# Patient Record
Sex: Male | Born: 1980 | Race: Black or African American | Hispanic: No | Marital: Married | Smoking: Former smoker
Health system: Southern US, Community
[De-identification: ages and names within clinical notes are randomized; demographics above are authoritative.]

## PROBLEM LIST (undated history)

## (undated) DIAGNOSIS — N200 Calculus of kidney: Secondary | ICD-10-CM

## (undated) DIAGNOSIS — I10 Essential (primary) hypertension: Secondary | ICD-10-CM

## (undated) DIAGNOSIS — E119 Type 2 diabetes mellitus without complications: Secondary | ICD-10-CM

## (undated) HISTORY — DX: Calculus of kidney: N20.0

## (undated) HISTORY — DX: Type 2 diabetes mellitus without complications: E11.9

---

## 2002-04-17 ENCOUNTER — Emergency Department (HOSPITAL_COMMUNITY): Admission: EM | Admit: 2002-04-17 | Discharge: 2002-04-17 | Payer: Self-pay | Admitting: Emergency Medicine

## 2003-05-01 ENCOUNTER — Emergency Department (HOSPITAL_COMMUNITY): Admission: EM | Admit: 2003-05-01 | Discharge: 2003-05-01 | Payer: Self-pay | Admitting: Emergency Medicine

## 2004-06-03 ENCOUNTER — Ambulatory Visit: Payer: Self-pay | Admitting: Family Medicine

## 2004-06-09 ENCOUNTER — Ambulatory Visit: Payer: Self-pay | Admitting: Family Medicine

## 2004-07-07 ENCOUNTER — Ambulatory Visit: Payer: Self-pay | Admitting: Family Medicine

## 2004-08-19 ENCOUNTER — Emergency Department (HOSPITAL_COMMUNITY): Admission: EM | Admit: 2004-08-19 | Discharge: 2004-08-19 | Payer: Self-pay | Admitting: Emergency Medicine

## 2004-09-10 ENCOUNTER — Encounter (HOSPITAL_COMMUNITY): Admission: RE | Admit: 2004-09-10 | Discharge: 2004-10-10 | Payer: Self-pay | Admitting: Orthopaedic Surgery

## 2005-04-30 ENCOUNTER — Ambulatory Visit: Payer: Self-pay | Admitting: Family Medicine

## 2005-05-28 ENCOUNTER — Ambulatory Visit: Payer: Self-pay | Admitting: Family Medicine

## 2005-06-30 ENCOUNTER — Ambulatory Visit: Payer: Self-pay | Admitting: Family Medicine

## 2005-07-07 ENCOUNTER — Ambulatory Visit: Payer: Self-pay | Admitting: Family Medicine

## 2005-11-23 ENCOUNTER — Emergency Department (HOSPITAL_COMMUNITY): Admission: EM | Admit: 2005-11-23 | Discharge: 2005-11-23 | Payer: Self-pay | Admitting: Emergency Medicine

## 2005-12-31 ENCOUNTER — Emergency Department (HOSPITAL_COMMUNITY): Admission: EM | Admit: 2005-12-31 | Discharge: 2005-12-31 | Payer: Self-pay | Admitting: Emergency Medicine

## 2006-01-14 ENCOUNTER — Ambulatory Visit: Payer: Self-pay | Admitting: Family Medicine

## 2006-02-25 ENCOUNTER — Ambulatory Visit: Payer: Self-pay | Admitting: Family Medicine

## 2006-03-15 ENCOUNTER — Encounter: Payer: Self-pay | Admitting: Family Medicine

## 2006-03-15 ENCOUNTER — Emergency Department (HOSPITAL_COMMUNITY): Admission: EM | Admit: 2006-03-15 | Discharge: 2006-03-15 | Payer: Self-pay | Admitting: Emergency Medicine

## 2006-03-15 DIAGNOSIS — I10 Essential (primary) hypertension: Secondary | ICD-10-CM | POA: Insufficient documentation

## 2006-03-15 DIAGNOSIS — F17200 Nicotine dependence, unspecified, uncomplicated: Secondary | ICD-10-CM | POA: Insufficient documentation

## 2006-03-15 DIAGNOSIS — E669 Obesity, unspecified: Secondary | ICD-10-CM

## 2006-04-08 ENCOUNTER — Ambulatory Visit: Payer: Self-pay | Admitting: Family Medicine

## 2006-05-31 ENCOUNTER — Telehealth (INDEPENDENT_AMBULATORY_CARE_PROVIDER_SITE_OTHER): Payer: Self-pay | Admitting: Family Medicine

## 2006-06-08 ENCOUNTER — Ambulatory Visit: Payer: Self-pay | Admitting: Family Medicine

## 2006-06-08 DIAGNOSIS — R809 Proteinuria, unspecified: Secondary | ICD-10-CM

## 2006-06-09 ENCOUNTER — Encounter (INDEPENDENT_AMBULATORY_CARE_PROVIDER_SITE_OTHER): Payer: Self-pay | Admitting: Family Medicine

## 2006-06-09 ENCOUNTER — Telehealth (INDEPENDENT_AMBULATORY_CARE_PROVIDER_SITE_OTHER): Payer: Self-pay | Admitting: Family Medicine

## 2006-06-09 LAB — CONVERTED CEMR LAB: RBC / HPF: NONE SEEN (ref <3)

## 2006-06-13 ENCOUNTER — Telehealth (INDEPENDENT_AMBULATORY_CARE_PROVIDER_SITE_OTHER): Payer: Self-pay | Admitting: Family Medicine

## 2006-07-27 ENCOUNTER — Encounter (INDEPENDENT_AMBULATORY_CARE_PROVIDER_SITE_OTHER): Payer: Self-pay | Admitting: Family Medicine

## 2006-10-11 ENCOUNTER — Ambulatory Visit: Payer: Self-pay | Admitting: Family Medicine

## 2007-04-27 ENCOUNTER — Encounter: Payer: Self-pay | Admitting: Family Medicine

## 2007-05-04 ENCOUNTER — Ambulatory Visit: Payer: Self-pay | Admitting: Family Medicine

## 2007-05-04 DIAGNOSIS — J00 Acute nasopharyngitis [common cold]: Secondary | ICD-10-CM | POA: Insufficient documentation

## 2007-05-04 LAB — CONVERTED CEMR LAB
Inflenza A Ag: NEGATIVE
Rapid Strep: NEGATIVE

## 2007-10-19 ENCOUNTER — Emergency Department (HOSPITAL_COMMUNITY): Admission: EM | Admit: 2007-10-19 | Discharge: 2007-10-19 | Payer: Self-pay | Admitting: Emergency Medicine

## 2008-09-06 ENCOUNTER — Emergency Department (HOSPITAL_COMMUNITY): Admission: EM | Admit: 2008-09-06 | Discharge: 2008-09-06 | Payer: Self-pay | Admitting: Emergency Medicine

## 2009-01-10 ENCOUNTER — Emergency Department (HOSPITAL_COMMUNITY): Admission: EM | Admit: 2009-01-10 | Discharge: 2009-01-10 | Payer: Self-pay | Admitting: Emergency Medicine

## 2010-05-24 LAB — CONVERTED CEMR LAB
Bilirubin Urine: NEGATIVE
Glucose, Urine, Semiquant: NEGATIVE
Ketones, urine, test strip: NEGATIVE
Nitrite: NEGATIVE
Protein, U semiquant: NEGATIVE
Specific Gravity, Urine: >=1.03
Urobilinogen, UA: 0.2
WBC Urine, dipstick: NEGATIVE
pH: 5.5

## 2010-05-28 NOTE — Letter (Signed)
Summary: Historic Patient File  Historic Patient File   Imported By: Lind Guest 05/01/2010 15:49:49  _____________________________________________________________________  External Attachment:    Type:   Image     Comment:   External Document

## 2010-07-31 LAB — BASIC METABOLIC PANEL
BUN: 16 mg/dL (ref 6–23)
CO2: 27 mEq/L (ref 19–32)
Calcium: 8.8 mg/dL (ref 8.4–10.5)
Chloride: 104 mEq/L (ref 96–112)
Creatinine, Ser: 1.32 mg/dL (ref 0.4–1.5)
GFR calc Af Amer: >60 mL/min (ref >60)
GFR calc non Af Amer: >60 mL/min (ref >60)
Glucose, Bld: 114 mg/dL — ABNORMAL HIGH (ref 70–99)
Potassium: 3.6 mEq/L (ref 3.5–5.1)
Sodium: 138 mEq/L (ref 135–145)

## 2010-07-31 LAB — ETHANOL: Alcohol, Ethyl (B): <5 mg/dL (ref 0–10)

## 2010-11-03 ENCOUNTER — Encounter: Payer: Self-pay | Admitting: *Deleted

## 2010-11-03 ENCOUNTER — Emergency Department (HOSPITAL_COMMUNITY)
Admission: EM | Admit: 2010-11-03 | Discharge: 2010-11-03 | Disposition: A | Discharge status: Home / Self Care | Payer: 59 | Attending: Emergency Medicine | Admitting: Emergency Medicine

## 2010-11-03 DIAGNOSIS — I10 Essential (primary) hypertension: Secondary | ICD-10-CM | POA: Insufficient documentation

## 2010-11-03 DIAGNOSIS — J029 Acute pharyngitis, unspecified: Secondary | ICD-10-CM | POA: Insufficient documentation

## 2010-11-03 DIAGNOSIS — J020 Streptococcal pharyngitis: Secondary | ICD-10-CM

## 2010-11-03 HISTORY — DX: Essential (primary) hypertension: I10

## 2010-11-03 LAB — RAPID STREP SCREEN (MED CTR MEBANE ONLY): Streptococcus, Group A Screen (Direct): POSITIVE — AB

## 2010-11-03 MED ORDER — PENICILLIN G BENZATHINE 1200000 UNIT/2ML IM SUSP
1.2000 10*6.[IU] | Freq: Once | INTRAMUSCULAR | Status: AC
  Administered 2010-11-03: 1.2 10*6.[IU] via INTRAMUSCULAR
  Filled 2010-11-03: qty 2

## 2010-11-03 MED ORDER — HYDROCODONE-ACETAMINOPHEN 7.5-500 MG/15ML PO SOLN
15.0000 mL | Freq: Four times a day (QID) | ORAL | Status: AC | PRN
Start: 2010-11-03 — End: 2010-11-13

## 2010-11-03 MED ORDER — ACETAMINOPHEN 500 MG PO TABS
ORAL_TABLET | ORAL | Status: AC
  Administered 2010-11-03: 16:00:00
  Filled 2010-11-03: qty 2

## 2010-11-03 MED ORDER — IBUPROFEN 800 MG PO TABS
800.0000 mg | ORAL_TABLET | Freq: Once | ORAL | Status: AC
  Administered 2010-11-03: 800 mg via ORAL
  Filled 2010-11-03: qty 1

## 2010-11-03 NOTE — ED Notes (Signed)
Medicated per protocol with Tylenol 1000mg  po for fever.

## 2010-11-03 NOTE — ED Notes (Signed)
C/o sore throat onset yesterday; states, "I feel like I have a fever".

## 2010-11-03 NOTE — ED Provider Notes (Signed)
History     Chief Complaint  Patient presents with  . Sore Throat   Patient is a 30 y.o. male presenting with pharyngitis.  Sore Throat This is a new problem. The current episode started yesterday. The problem occurs constantly. The problem has been gradually worsening. Associated symptoms include a fever, myalgias, a sore throat and swollen glands. Pertinent negatives include no abdominal pain, chest pain, chills, congestion, coughing, headaches, nausea, neck pain, numbness, vomiting or weakness. The symptoms are aggravated by drinking, eating and swallowing. He has tried nothing for the symptoms. The treatment provided no relief.    Past Medical History  Diagnosis Date  . Hypertension     History reviewed. No pertinent past surgical history.  History reviewed. No pertinent family history.  History  Substance Use Topics  . Smoking status: Current Everyday Smoker -- 0.5 packs/day    Types: Cigarettes  . Smokeless tobacco: Not on file  . Alcohol Use: No      Review of Systems  Constitutional: Positive for fever. Negative for chills and appetite change.  HENT: Positive for sore throat. Negative for ear pain, congestion, trouble swallowing, neck pain, neck stiffness and voice change.   Eyes: Negative for pain.  Respiratory: Negative for cough, chest tightness and wheezing.   Cardiovascular: Negative for chest pain.  Gastrointestinal: Negative for nausea, vomiting and abdominal pain.  Musculoskeletal: Positive for myalgias.  Skin: Negative.   Neurological: Negative for weakness, numbness and headaches.  Hematological: Does not bruise/bleed easily.    Physical Exam  BP 137/81  Pulse 93  Temp(Src) 101.8 F (38.8 C) (Oral)  Resp 22  Ht 6' (1.829 m)  Wt 265 lb (120.203 kg)  BMI 35.94 kg/m2  SpO2 97%  Physical Exam  Constitutional: He is oriented to person, place, and time. He appears well-developed and well-nourished.  HENT:  Head: Normocephalic and atraumatic. No  trismus in the jaw.  Right Ear: External ear normal.  Left Ear: External ear normal.  Mouth/Throat: Uvula is midline and mucous membranes are normal. No uvula swelling. Oropharyngeal exudate present. No tonsillar abscesses.  Eyes: Conjunctivae are normal. Pupils are equal, round, and reactive to light.  Neck: Normal range of motion. Neck supple. No tracheal deviation present.  Cardiovascular: Normal rate, regular rhythm and normal heart sounds.   Pulmonary/Chest: Effort normal and breath sounds normal. No stridor.  Abdominal: Soft. There is no tenderness.  Musculoskeletal: Normal range of motion.  Lymphadenopathy:    He has cervical adenopathy.  Neurological: He is alert and oriented to person, place, and time.  Skin: Skin is warm and dry.  Psychiatric: He has a normal mood and affect.    ED Course  Procedures  MDM  Patient is non-toxic appearing.  Large amt of exudates to bilateral tonsils with erythema of the tonsils.  Uvula is midline.  Vitals stable, handle his own secretions well .  No peritonsillar abscess, no meningeal signs      Chrisopher Pustejovsky L. [place], [place] 11/07/10 2246

## 2010-11-10 NOTE — ED Provider Notes (Signed)
Medical screening examination/treatment/procedure(s) were performed by non-physician practitioner and as supervising physician I was immediately available for consultation/collaboration.  Joya Gaskins, MD 11/10/10 [phone]

## 2010-12-11 ENCOUNTER — Ambulatory Visit (INDEPENDENT_AMBULATORY_CARE_PROVIDER_SITE_OTHER): Payer: 59 | Admitting: Family Medicine

## 2010-12-11 ENCOUNTER — Encounter: Payer: Self-pay | Admitting: Family Medicine

## 2010-12-11 VITALS — BP 138/92 | HR 74 | Resp 16 | Ht 72.25 in | Wt 284.8 lb

## 2010-12-11 DIAGNOSIS — E669 Obesity, unspecified: Secondary | ICD-10-CM

## 2010-12-11 DIAGNOSIS — I10 Essential (primary) hypertension: Secondary | ICD-10-CM

## 2010-12-11 DIAGNOSIS — F17200 Nicotine dependence, unspecified, uncomplicated: Secondary | ICD-10-CM

## 2010-12-11 MED ORDER — AMLODIPINE BESYLATE 10 MG PO TABS: 10.0000 mg | ORAL_TABLET | Freq: Every day | ORAL | Status: DC

## 2010-12-11 NOTE — Assessment & Plan Note (Signed)
Patient counseled to quit tobacco. He is already started decreasing the amount of cigarettes he smokes. He has not set a quit date

## 2010-12-11 NOTE — Patient Instructions (Signed)
I will get your records from the health department You will need a tetanus shot  Continue your blood pressure medication Work on your weight loss, lower your carbs, eat more frutis and veggies, avoid soda and fruit juices loaded with sugar I advise you to quit smoking! We can help if you like. Return visit in 6 months

## 2010-12-11 NOTE — Assessment & Plan Note (Addendum)
Importance of weight loss and diet discussed. Advised patient to start with one change at a time. He will start with fruit or veggies with   at least 2 meals. He was has already started his walking program.    Patient declines tetanus shot today states he will get it at next visit

## 2010-12-11 NOTE — Progress Notes (Signed)
  Subjective:    Patient ID: John Cisneros, male    DOB: October 31, 1980, 30 y.o.   MRN: [id number]  HPI patient here to establish care. We'll history medications were reviewed. He has no specific concerns today.  [facility], Dierdre Forth- previous PCP  Works at [employer] doctor- [employer], wears contacts  Hypertension- history of hypertension. He was previously on metoprolol however had problems with erectile dysfunction on this medication. He was subsequently switched to amlodipine which he continues to take. He denies any side effects of the medication. His blood pressure typically runs 140 systolic over 80s to 90s diastolic. He states his labs have been drawn recently at the health department. His cholesterol was also done and they told him this was okay  Obesity- patient started to walk a few days a week. This is his heaviest weight. He states he's been to 275 pounds for the past 3-4 years. He does eat out a lot.   Tobacco use- he has been smoking for the past 10 years. He states he is trying to quit at this time and knows he needs to. He has been cutting back on his cigarettes.  Review of Systems      GEN- denies fatigue, fever, weight loss,weakness, recent illness HEENT- denies  change in vision, CVS- denies chest pain, palpitations RESP- denies SOB, cough, wheeze ABD- denies N/V, change in stools, abd pain GU- denies dysuria, hematuria, dribbling, urgency MSK- denies joint pain, muscle aches, injury Neuro- denies headache, dizziness, syncope, seizure activity      Objective:   Physical Exam GEN- NAD, alert and oriented x3, overweight HEENT- PERRL, EOMI, non injected sclera, pink conjunctiva, MMM, oropharynx clear, fundoscopic exam benign Neck- Supple, no thryomegaly CVS- RRR, no murmur RESP-CTAB EXT- No edema Pulses- Radial, DP- 2+        Assessment & Plan:

## 2010-12-11 NOTE — Assessment & Plan Note (Signed)
I will continue his blood pressure medication at the same dose. Goal for him is less than 140/90. I encouraged him that if he loses weight and changes his diet he may be able to decrease his blood pressure medication or possibly come off of it. I will obtain the records from his previous Doctor

## 2011-01-21 LAB — URINALYSIS, ROUTINE W REFLEX MICROSCOPIC
Bilirubin Urine: NEGATIVE
Glucose, UA: NEGATIVE
Ketones, ur: NEGATIVE
Leukocytes, UA: NEGATIVE
Nitrite: NEGATIVE
Specific Gravity, Urine: 1.025
Urobilinogen, UA: 0.2
pH: 5.5

## 2011-01-21 LAB — POCT I-STAT, CHEM 8
BUN: 10
Calcium, Ion: 1.13
Chloride: 102
Creatinine, Ser: 1.4
Glucose, Bld: 109 — ABNORMAL HIGH
HCT: 50
Hemoglobin: 17
Potassium: 3.8
Sodium: 139
TCO2: 26

## 2011-01-21 LAB — URINE MICROSCOPIC-ADD ON

## 2011-03-01 ENCOUNTER — Ambulatory Visit (INDEPENDENT_AMBULATORY_CARE_PROVIDER_SITE_OTHER): Payer: Self-pay | Admitting: Family Medicine

## 2011-03-01 ENCOUNTER — Encounter: Payer: Self-pay | Admitting: Family Medicine

## 2011-03-01 VITALS — BP 120/90 | HR 106 | Resp 16 | Ht 72.25 in | Wt 284.0 lb

## 2011-03-01 DIAGNOSIS — M549 Dorsalgia, unspecified: Secondary | ICD-10-CM

## 2011-03-01 DIAGNOSIS — I10 Essential (primary) hypertension: Secondary | ICD-10-CM

## 2011-03-01 DIAGNOSIS — R3129 Other microscopic hematuria: Secondary | ICD-10-CM | POA: Insufficient documentation

## 2011-03-01 DIAGNOSIS — N200 Calculus of kidney: Secondary | ICD-10-CM | POA: Insufficient documentation

## 2011-03-01 LAB — POCT URINALYSIS DIPSTICK
Bilirubin, UA: NEGATIVE
Glucose, UA: NEGATIVE
Ketones, UA: NEGATIVE
Nitrite, UA: NEGATIVE
Protein, UA: 30
Spec Grav, UA: >=1.03
Urobilinogen, UA: 1
pH, UA: 6

## 2011-03-01 MED ORDER — HYDROCODONE-ACETAMINOPHEN 5-500 MG PO TABS: 1.0000 | ORAL_TABLET | Freq: Two times a day (BID) | ORAL | Status: DC | PRN

## 2011-03-01 MED ORDER — CYCLOBENZAPRINE HCL 10 MG PO TABS: 10.0000 mg | ORAL_TABLET | Freq: Three times a day (TID) | ORAL | Status: DC | PRN

## 2011-03-01 NOTE — Progress Notes (Signed)
Addended by: Abner Greenspan on: 03/01/2011 05:17 PM   Modules accepted: Orders

## 2011-03-01 NOTE — Progress Notes (Signed)
  Subjective:    Patient ID: John Cisneros, male    DOB: 1980-06-29, 30 y.o.   MRN: [id number]  HPI Patient presents with lumbar back pain x3 weeks. He denies any specific injury to the back. No history of previous back pain. He does have a history of kidney stone he states that this feels similar to that. ROS- He denies any dysuria,n/v, change in stools, penile discharge, hematuria. He denies any radicular symptoms. Denies paresthesias of the lower extremities. Denies any change in bowel or bladder.   Review of Systems - per above     Objective:   Physical Exam GEN-NAD, alert and oriented x 3 Spine- mild TTP lumbar region, mild paraspinal spasms in lumbar region Back- neg SLR, motor equal bilat, mild pain with IR of bilat hip in back same region, no pain with ER for hip, no pain with flexion or extension ABd- Neg CVA tenderness Neuro- sensation in tact, gait non antaglic, DTR symmetric in lower ext       Assessment & Plan:

## 2011-03-01 NOTE — Patient Instructions (Signed)
For your back, try to stretch as much as possible I will get a CT scan because of the blood to look for a kidney stone  Take the pain medication as needed  Use the muscle relaxant  I will call with results of the CT scan

## 2011-03-01 NOTE — Assessment & Plan Note (Signed)
Blood pressure much improved on Norvasc will continue

## 2011-03-01 NOTE — Assessment & Plan Note (Signed)
Exam was concerning for your musculoskeletal pain versus possible kidney stone. I will obtain a CT scan of the abdomen and pelvis looking for stone. Patient was given Flexeril as well as a short-term course of hydrocodone for pain.

## 2011-03-01 NOTE — Assessment & Plan Note (Signed)
History of stone will obtain scan

## 2011-03-01 NOTE — Assessment & Plan Note (Signed)
Microscopic hematuria noted and a large amount. Patient was sent for CT scan for possible kidney stone. If this is negative he will need to return to have urine rechecked. If blood still noted as he also has history of proteinuria was sent for further evaluation.

## 2011-04-23 ENCOUNTER — Encounter (HOSPITAL_COMMUNITY): Payer: Self-pay | Admitting: *Deleted

## 2011-04-23 ENCOUNTER — Other Ambulatory Visit: Payer: Self-pay

## 2011-04-23 ENCOUNTER — Emergency Department (HOSPITAL_COMMUNITY): Payer: Self-pay

## 2011-04-23 ENCOUNTER — Emergency Department (HOSPITAL_COMMUNITY)
Admission: EM | Admit: 2011-04-23 | Discharge: 2011-04-24 | Disposition: A | Discharge status: Home / Self Care | Payer: Self-pay | Attending: Emergency Medicine | Admitting: Emergency Medicine

## 2011-04-23 DIAGNOSIS — I10 Essential (primary) hypertension: Secondary | ICD-10-CM | POA: Insufficient documentation

## 2011-04-23 DIAGNOSIS — R079 Chest pain, unspecified: Secondary | ICD-10-CM | POA: Insufficient documentation

## 2011-04-23 DIAGNOSIS — R0789 Other chest pain: Secondary | ICD-10-CM | POA: Insufficient documentation

## 2011-04-23 DIAGNOSIS — F17200 Nicotine dependence, unspecified, uncomplicated: Secondary | ICD-10-CM | POA: Insufficient documentation

## 2011-04-23 LAB — COMPREHENSIVE METABOLIC PANEL
ALT: 32 U/L (ref 0–53)
AST: 23 U/L (ref 0–37)
Albumin: 4.2 g/dL (ref 3.5–5.2)
Alkaline Phosphatase: 73 U/L (ref 39–117)
BUN: 11 mg/dL (ref 6–23)
CO2: 31 mEq/L (ref 19–32)
Calcium: 10.4 mg/dL (ref 8.4–10.5)
Chloride: 100 mEq/L (ref 96–112)
Creatinine, Ser: 1.13 mg/dL (ref 0.50–1.35)
GFR calc Af Amer: >90 mL/min (ref >90)
GFR calc non Af Amer: 87 mL/min — ABNORMAL LOW (ref >90)
Glucose, Bld: 94 mg/dL (ref 70–99)
Potassium: 4.1 mEq/L (ref 3.5–5.1)
Sodium: 139 mEq/L (ref 135–145)
Total Bilirubin: 0.3 mg/dL (ref 0.3–1.2)
Total Protein: 7.8 g/dL (ref 6.0–8.3)

## 2011-04-23 LAB — CBC
HCT: 47.7 % (ref 39.0–52.0)
Hemoglobin: 16.3 g/dL (ref 13.0–17.0)
MCH: 30.5 pg (ref 26.0–34.0)
MCHC: 34.2 g/dL (ref 30.0–36.0)
MCV: 89.3 fL (ref 78.0–100.0)
Platelets: 315 10*3/uL (ref 150–400)
RBC: 5.34 MIL/uL (ref 4.22–5.81)
RDW: 12.2 % (ref 11.5–15.5)
WBC: 13.1 10*3/uL — ABNORMAL HIGH (ref 4.0–10.5)

## 2011-04-23 LAB — POCT I-STAT TROPONIN I: Troponin i, poc: 0 ng/mL (ref 0.00–0.08)

## 2011-04-23 NOTE — ED Notes (Signed)
Pt in c/o med sternal chest pain since eating tonight, also tingling feeling in left arm

## 2011-04-24 NOTE — ED Provider Notes (Signed)
Medical screening examination/treatment/procedure(s) were performed by non-physician practitioner and as supervising physician I was immediately available for consultation/collaboration.   [place] Ang M Flay Ghosh, MD 04/24/11 0817 

## 2011-04-24 NOTE — ED Provider Notes (Signed)
History     CSN: [id number]  Arrival date & time 04/23/11  2154   First MD Initiated Contact with Patient 04/24/11 0004      Chief Complaint  Patient presents with  . Chest Pain    (Consider location/radiation/quality/duration/timing/severity/associated sxs/prior treatment) HPI Comments: [name] developed sharp, stinging, mid chest pain after eating Chinese food, lasted approximately 30 minutes.  It was intermittent in nature.  He took TUMS with resolution  Patient is a 30 y.o. male presenting with chest pain. The history is provided by the patient.  Chest Pain Duration of episode(s) is 30 minutes. Chest pain occurs intermittently. The chest pain is resolved. At its most intense, the pain is at 2/10. The pain is currently at 0/10. The severity of the pain is mild. The quality of the pain is described as stabbing. The pain does not radiate. Pertinent negatives for primary symptoms include no shortness of breath, no cough, no nausea, no vomiting and no dizziness.  Pertinent negatives for associated symptoms include no weakness.     Past Medical History  Diagnosis Date  . Hypertension   . Kidney stone     History reviewed. No pertinent past surgical history.  Family History  Problem Relation Age of Onset  . Heart disease Maternal Grandmother   . Heart disease Maternal Grandfather   . Heart disease Paternal Grandmother   . Heart disease Paternal Grandfather     History  Substance Use Topics  . Smoking status: Current Everyday Smoker -- 0.5 packs/day    Types: Cigarettes  . Smokeless tobacco: Not on file  . Alcohol Use: 0.6 oz/week    1 Cans of beer per week      Review of Systems  Respiratory: Negative for cough and shortness of breath.   Cardiovascular: Positive for chest pain. Negative for leg swelling.  Gastrointestinal: Negative for nausea and vomiting.  Neurological: Negative for dizziness and weakness.    Allergies  Review of patient's allergies  indicates no known allergies.  Home Medications   Current Outpatient Rx  Name Route Sig Dispense Refill  . AMLODIPINE BESYLATE 10 MG PO TABS Oral Take 1 tablet (10 mg total) by mouth daily. 90 tablet 1    BP 143/101  Pulse 99  Temp(Src) 98 F (36.7 C) (Oral)  Resp 20  SpO2 100%  Physical Exam  Constitutional: He is oriented to person, place, and time. He appears well-developed.  HENT:  Head: Normocephalic.  Eyes: Pupils are equal, round, and reactive to light.  Neck: Normal range of motion.  Cardiovascular: Normal rate.   Pulmonary/Chest: Effort normal.  Abdominal: Soft.  Musculoskeletal: Normal range of motion.  Neurological: He is oriented to person, place, and time.  Skin: Skin is warm.  Psychiatric: He has a normal mood and affect.    ED Course  Procedures (including critical care time)  Labs Reviewed  CBC - Abnormal; Notable for the following:    WBC 13.1 (*)    All other components within normal limits  COMPREHENSIVE METABOLIC PANEL - Abnormal; Notable for the following:    GFR calc non Af Amer 87 (*)    All other components within normal limits  POCT I-STAT TROPONIN I  I-STAT TROPONIN I   Dg Chest 2 View  04/23/2011  *RADIOLOGY REPORT*  Clinical Data: Anterior chest pain.  Smoker with history of hypertension.  CHEST - 2 VIEW 04/23/2011:  Comparison: Two-view chest x-ray 09/06/2008 [facility].  Findings: Cardiac silhouette upper normal in size  to slightly enlarged but stable.  Hilar and mediastinal contours otherwise unremarkable.  Lungs clear.  Bronchovascular markings normal. Pulmonary vascularity normal.  No pleural effusions.  Visualized bony thorax intact.  IMPRESSION: Borderline heart size.  No acute cardiopulmonary disease.  Original Report Authenticated By: Arnell Sieving, M.D.     1. Hypertension   2. Chest pain, non-cardiac       MDM  Chest pain, per se, indigestion, most likely indigestion.  This resolved with time and has not  recurred.  Patient does have a history of hypertension, for which she is followed by a physician in retail.  Takes amlodipine daily        Arman Filter, NP 04/24/11 0026  Arman Filter, NP 04/24/11 [phone]

## 2011-05-13 ENCOUNTER — Ambulatory Visit: Payer: Self-pay | Admitting: Family Medicine

## 2011-05-13 ENCOUNTER — Encounter: Payer: Self-pay | Admitting: Family Medicine

## 2011-05-13 ENCOUNTER — Ambulatory Visit (INDEPENDENT_AMBULATORY_CARE_PROVIDER_SITE_OTHER): Payer: 59 | Admitting: Family Medicine

## 2011-05-13 VITALS — BP 130/88 | HR 92 | Resp 18 | Ht 72.25 in | Wt 289.0 lb

## 2011-05-13 DIAGNOSIS — M549 Dorsalgia, unspecified: Secondary | ICD-10-CM

## 2011-05-13 DIAGNOSIS — N200 Calculus of kidney: Secondary | ICD-10-CM

## 2011-05-13 DIAGNOSIS — R519 Headache, unspecified: Secondary | ICD-10-CM | POA: Insufficient documentation

## 2011-05-13 DIAGNOSIS — I10 Essential (primary) hypertension: Secondary | ICD-10-CM

## 2011-05-13 DIAGNOSIS — R51 Headache: Secondary | ICD-10-CM

## 2011-05-13 DIAGNOSIS — E669 Obesity, unspecified: Secondary | ICD-10-CM

## 2011-05-13 MED ORDER — AMLODIPINE BESYLATE 10 MG PO TABS: 10.0000 mg | ORAL_TABLET | Freq: Every day | ORAL | Status: DC

## 2011-05-13 MED ORDER — HYDROCODONE-ACETAMINOPHEN 5-500 MG PO TABS: 1.0000 | ORAL_TABLET | Freq: Four times a day (QID) | ORAL | Status: DC | PRN

## 2011-05-13 MED ORDER — KETOROLAC TROMETHAMINE 60 MG/2ML IM SOLN
60.0000 mg | Freq: Once | INTRAMUSCULAR | Status: AC
  Administered 2011-05-13: 60 mg via INTRAMUSCULAR

## 2011-05-13 NOTE — Assessment & Plan Note (Signed)
Overall blood pressure looks good. We'll continue Norvasc, obtain labs

## 2011-05-13 NOTE — Patient Instructions (Signed)
For your headache, if your vision gets worse or the Headache does not go away please call Use the pain medication for your head and your back I will set you up for a CT scan and x-rays of your back Get your blood work done, do not eat after midnight  F/U 3 weeks

## 2011-05-13 NOTE — Assessment & Plan Note (Signed)
Oriented importance of keeping weight at a normal level. Encouraged exercise and change in diet low-fat. Will obtain fasting lipid panel,screen for diabetes mellitus

## 2011-05-13 NOTE — Assessment & Plan Note (Signed)
CT scan secondary to back pain and hematuria history of stones

## 2011-05-13 NOTE — Assessment & Plan Note (Signed)
This is a new onset headache. No red flags on exam. His blood pressures not severely elevated. I will obtain labs. He was given a shot of Toradol and the clinic. He is pain medication for his back that he can use for his headache. Will continue to evaluate.

## 2011-05-13 NOTE — Progress Notes (Signed)
  Subjective:    Patient ID: John Cisneros, male    DOB: [birth year], 31 y.o.   MRN: [id number]  HPI Hypertension- patient here to followup his blood pressure. He's been taking his Norvasc as prescribed. Was in the ED approximately one month ago with chest pain. He had normal workup at that time. He does note he has had a headache which started approximately 2 hours ago. His vision has been blurry he feels jittery. He feels like his blood pressure is elevated. He's had another episode of this a few days ago when he felt like his blood pressure was however he was unable to take it. He was concerned his potassium might be low. He denies nausea, vomiting, chest pain, shortness of breath. Mild photophobia. He has not tried any over-the-counter medications.  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 His back pain is worse after lying in bed or in a lying position. He denies any specific injury. He try the Flexeril hydrocodone which helps him at the last visit. He denies any change in bowel or bladder.  Hematuria- history of proteinuria and hematuria. He's had a history of kidneys stones. He has not been able to get the CT scan done because of previous problems with his insurance.  Obesity- he continues to gain weight states he is not eating very much, however he does not exercise  Review of Systems  GEN- denies fatigue, fever, weight loss,weakness, recent illness HEENT- denies eye drainage, +change in vision, nasal discharge, CVS- denies chest pain, palpitations, leg edema RESP- denies SOB, cough, wheeze ABD- denies N/V, change in stools, abd pain GU- denies dysuria, hematuria, dribbling, incontinence MSK- denies joint pain, muscle aches, injury,+back pain Neuro-+ headache,denies dizziness, syncope, seizure activity       Objective:   Physical Exam  GEN- NAD, alert and oriented x3 HEENT- PERRL, EOMI, non injected sclera,  pink conjunctiva, MMM, oropharynx clear, fundoscopic exam benign Neck- Supple, no thryomegaly CVS- RRR, no murmur RESP-CTAB EXT- No edema Pulses- Radial, DP- 2+ Neuro- CNII-XII in tact, no focal deficits  Back- TTP lumbar region, no CVA tenderness, minimal pain with flexion        Assessment & Plan:

## 2011-05-13 NOTE — Assessment & Plan Note (Signed)
Obtain plain films the lumbar spine with persistent pain. CT scan of abdomen pelvis for kidney stone Short-term Vicodin refill

## 2011-05-20 LAB — COMPREHENSIVE METABOLIC PANEL
AST: 21 U/L (ref 0–37)
Alkaline Phosphatase: 61 U/L (ref 39–117)
BUN: 13 mg/dL (ref 6–23)
Creat: 1.29 mg/dL (ref 0.50–1.35)
Glucose, Bld: 101 mg/dL — ABNORMAL HIGH (ref 70–99)
Total Bilirubin: 0.7 mg/dL (ref 0.3–1.2)

## 2011-05-20 LAB — CBC
MCV: 93.3 fL (ref 78.0–100.0)
Platelets: 304 10*3/uL (ref 150–400)
RBC: 5.25 MIL/uL (ref 4.22–5.81)
RDW: 12.5 % (ref 11.5–15.5)

## 2011-05-20 LAB — LIPID PANEL
Cholesterol: 99 mg/dL (ref 0–200)
LDL Cholesterol: 50 mg/dL (ref 0–99)
Triglycerides: 116 mg/dL (ref <150)

## 2011-05-20 NOTE — Progress Notes (Signed)
Addended by: Milinda Antis F on: 05/20/2011 01:17 PM   Modules accepted: Orders

## 2011-05-25 ENCOUNTER — Ambulatory Visit (HOSPITAL_COMMUNITY)
Admission: RE | Admit: 2011-05-25 | Discharge: 2011-05-25 | Disposition: A | Discharge status: Home / Self Care | Payer: Managed Care, Other (non HMO) | Source: Ambulatory Visit | Attending: Family Medicine | Admitting: Family Medicine

## 2011-05-25 ENCOUNTER — Encounter (HOSPITAL_COMMUNITY): Payer: Self-pay

## 2011-05-25 DIAGNOSIS — R3129 Other microscopic hematuria: Secondary | ICD-10-CM | POA: Insufficient documentation

## 2011-05-25 DIAGNOSIS — R1032 Left lower quadrant pain: Secondary | ICD-10-CM | POA: Insufficient documentation

## 2011-05-25 DIAGNOSIS — N200 Calculus of kidney: Secondary | ICD-10-CM

## 2011-05-25 DIAGNOSIS — M549 Dorsalgia, unspecified: Secondary | ICD-10-CM | POA: Insufficient documentation

## 2011-05-25 DIAGNOSIS — R1031 Right lower quadrant pain: Secondary | ICD-10-CM | POA: Insufficient documentation

## 2011-05-26 ENCOUNTER — Telehealth: Payer: Self-pay | Admitting: Family Medicine

## 2011-05-26 DIAGNOSIS — R3129 Other microscopic hematuria: Secondary | ICD-10-CM

## 2011-05-26 NOTE — Telephone Encounter (Signed)
Spoke with pt and he is aware of referral and results.

## 2011-05-26 NOTE — Telephone Encounter (Signed)
Please let [name] note that his CT scan did not show any evidence of kidney stones. Incidentally we did find benign cyst on his liver but they're very tiny and of no concern. Since he still has blood in his urine I want to send him to urology to be evaluated. The rest of the scan was normal.

## 2011-05-31 ENCOUNTER — Other Ambulatory Visit (HOSPITAL_COMMUNITY): Payer: Self-pay | Admitting: Urology

## 2011-05-31 DIAGNOSIS — R3129 Other microscopic hematuria: Secondary | ICD-10-CM

## 2011-06-07 ENCOUNTER — Telehealth: Payer: Self-pay

## 2011-06-07 NOTE — Telephone Encounter (Signed)
Left message for pt to return call.

## 2011-06-07 NOTE — Telephone Encounter (Signed)
He can bring a UA sample. I will treat based on results. I have not seen urology note yet.

## 2011-06-07 NOTE — Telephone Encounter (Signed)
Pt aware and will come by office for nurse visit sometime in the next couple of days.

## 2011-06-08 ENCOUNTER — Ambulatory Visit (HOSPITAL_COMMUNITY): Admission: RE | Admit: 2011-06-08 | Payer: Managed Care, Other (non HMO) | Source: Ambulatory Visit

## 2011-06-14 ENCOUNTER — Ambulatory Visit: Payer: 59 | Admitting: Family Medicine

## 2011-06-14 ENCOUNTER — Ambulatory Visit (HOSPITAL_COMMUNITY): Payer: Managed Care, Other (non HMO)

## 2011-06-15 ENCOUNTER — Ambulatory Visit (INDEPENDENT_AMBULATORY_CARE_PROVIDER_SITE_OTHER): Payer: Managed Care, Other (non HMO) | Admitting: Family Medicine

## 2011-06-15 ENCOUNTER — Encounter: Payer: Self-pay | Admitting: Family Medicine

## 2011-06-15 DIAGNOSIS — R300 Dysuria: Secondary | ICD-10-CM

## 2011-06-15 DIAGNOSIS — E669 Obesity, unspecified: Secondary | ICD-10-CM

## 2011-06-15 DIAGNOSIS — M549 Dorsalgia, unspecified: Secondary | ICD-10-CM

## 2011-06-15 DIAGNOSIS — R3129 Other microscopic hematuria: Secondary | ICD-10-CM

## 2011-06-15 DIAGNOSIS — R319 Hematuria, unspecified: Secondary | ICD-10-CM

## 2011-06-15 DIAGNOSIS — I10 Essential (primary) hypertension: Secondary | ICD-10-CM

## 2011-06-15 LAB — POCT URINALYSIS DIPSTICK
Ketones, UA: NEGATIVE
Leukocytes, UA: NEGATIVE

## 2011-06-15 NOTE — Progress Notes (Signed)
  Subjective:    Patient ID: John Cisneros, male    DOB: 09/03/1980, 31 y.o.   MRN: [id number]  HPI   Hematuria- patient seen by urology. He was unable to afford the multiple tests needed. Have not received a note back. Today he is complaining of some dysuria that he had about a week ago. He denies any sexual contact. Denies any penile lesions or penis drainage.  HTN- no complaints with BP meds, labs reviewed  Back pain- reviewed x-ray, back pain improved, he is now walking for exercise    Review of Systems  GEN- denies fatigue, fever, weight loss,weakness, recent illness CVS- denies chest pain, palpitations RESP- denies SOB, cough, wheeze ABD- denies N/V, change in stools, abd pain GU- denies dysuria,gross  hematuria, dribbling, incontinence MSK- denies joint pain,occ  muscle aches, injury Neuro- denies headache, dizziness, syncope, seizure activity       Objective:   Physical Exam GEN- NAD, alert and oriented x3, obese CVS- RRR, no murmur RESP-CTAB ABD- NABS, soft, NT, ND, no suprapubic tenderness, no CVA tenderness EXT- No edema Pulses- Radial, DP- 2+        Assessment & Plan:

## 2011-06-15 NOTE — Patient Instructions (Signed)
I will get the records from Dr. Yaakov Guthrie the urologist Continue your blood pressure medications Your labs look good. F/U in 6 months for your blood pressure

## 2011-06-15 NOTE — Assessment & Plan Note (Signed)
Improved, neg x-ray, encouraged weight loss and exercise

## 2011-06-15 NOTE — Assessment & Plan Note (Signed)
Good control, no change to meds, labs look good

## 2011-06-15 NOTE — Assessment & Plan Note (Signed)
Will culture urine

## 2011-06-15 NOTE — Assessment & Plan Note (Signed)
Weight down 3lbs, continue walking program

## 2011-06-15 NOTE — Assessment & Plan Note (Signed)
Will obtain records, persistent blood in urine. CT scan did not show any stone, though small ones can be missed

## 2011-06-18 LAB — URINE CULTURE: Colony Count: NO GROWTH

## 2011-06-30 ENCOUNTER — Other Ambulatory Visit: Payer: Self-pay

## 2011-06-30 MED ORDER — AMLODIPINE BESYLATE 10 MG PO TABS: 10.0000 mg | ORAL_TABLET | Freq: Every day | ORAL | Status: DC

## 2011-09-07 ENCOUNTER — Ambulatory Visit: Payer: Managed Care, Other (non HMO) | Admitting: Family Medicine

## 2011-10-05 ENCOUNTER — Encounter: Payer: Self-pay | Admitting: Family Medicine

## 2011-10-05 ENCOUNTER — Ambulatory Visit (INDEPENDENT_AMBULATORY_CARE_PROVIDER_SITE_OTHER): Payer: Managed Care, Other (non HMO) | Admitting: Family Medicine

## 2011-10-05 VITALS — BP 130/90 | HR 88 | Resp 16 | Ht 72.25 in | Wt 284.0 lb

## 2011-10-05 DIAGNOSIS — I10 Essential (primary) hypertension: Secondary | ICD-10-CM

## 2011-10-05 DIAGNOSIS — E669 Obesity, unspecified: Secondary | ICD-10-CM

## 2011-10-05 DIAGNOSIS — R21 Rash and other nonspecific skin eruption: Secondary | ICD-10-CM

## 2011-10-05 MED ORDER — CLOTRIMAZOLE-BETAMETHASONE 1-0.05 % EX CREA: TOPICAL_CREAM | CUTANEOUS | Status: DC

## 2011-10-05 MED ORDER — AMLODIPINE BESYLATE 10 MG PO TABS: 10.0000 mg | ORAL_TABLET | Freq: Every day | ORAL | Status: DC

## 2011-10-05 NOTE — Assessment & Plan Note (Signed)
Weight down 2lbs

## 2011-10-05 NOTE — Progress Notes (Signed)
  Subjective:    Patient ID: John Cisneros, male    DOB: May 22, 1980, 31 y.o.   MRN: [id number]  HPI Patient presents with itching in groin for the past week. He is not sure if he is at contact with anything. He denies any discharge from the penis, dysuria, blood in urine. He denies abdominal pain. He denies any bumps or lesions on the skin. He states he just itches around his scrotal sac. He is a monogamous relationship he does not use condoms. Hypertension-tolerating blood pressure medication, no problems   Review of Systems - per above   GEN- denies fatigue, fever, weight loss,weakness, recent illness HEENT- denies eye drainage, change in vision, nasal discharge, CVS- denies chest pain, palpitations RESP- denies SOB, cough, wheeze ABD- denies N/V, change in stools, abd pain GU- denies dysuria, hematuria, dribbling, incontinence MSK- denies joint pain, muscle aches, injury Neuro- denies headache, dizziness, syncope, seizure activity      Objective:   Physical Exam GEN- NAD, alert and oriented x3 HEENT- , EOMI, non injected sclera, pink conjunctiva, MMM, oropharynx clear CVS- RRR, no murmur RESP-CTAB ABD-NT,ND,NABS EXT- No edema Pulses- Radial, DP- 2+ Skin- mild erythema in creases groin, no lesions seen,  GU- testes descended bilat, no pain with palpation, no penile discharge, no lesions on head of penis       Assessment & Plan:

## 2011-10-05 NOTE — Assessment & Plan Note (Signed)
He has some mild irrtation noted in the groin region,no specific lesions, will give lotrisone cream BID, he will call if he sees new lesions

## 2011-10-05 NOTE — Patient Instructions (Signed)
Use cream to groin area twice a day for 1 week- treating for more fungal and irritation  Continue your blood pressure pill  F/U 6 months

## 2011-10-05 NOTE — Assessment & Plan Note (Signed)
Bp well controlled, no change to meds

## 2011-12-31 ENCOUNTER — Encounter (HOSPITAL_COMMUNITY): Payer: Self-pay | Admitting: *Deleted

## 2011-12-31 DIAGNOSIS — I10 Essential (primary) hypertension: Secondary | ICD-10-CM | POA: Insufficient documentation

## 2011-12-31 DIAGNOSIS — F17200 Nicotine dependence, unspecified, uncomplicated: Secondary | ICD-10-CM | POA: Insufficient documentation

## 2011-12-31 DIAGNOSIS — R209 Unspecified disturbances of skin sensation: Secondary | ICD-10-CM | POA: Insufficient documentation

## 2011-12-31 DIAGNOSIS — N289 Disorder of kidney and ureter, unspecified: Secondary | ICD-10-CM | POA: Insufficient documentation

## 2011-12-31 LAB — CBC WITH DIFFERENTIAL/PLATELET
Basophils Absolute: 0 10*3/uL (ref 0.0–0.1)
Basophils Relative: 0 % (ref 0–1)
Eosinophils Absolute: 0.2 10*3/uL (ref 0.0–0.7)
Eosinophils Relative: 2 % (ref 0–5)
HCT: 47.2 % (ref 39.0–52.0)
Hemoglobin: 16.2 g/dL (ref 13.0–17.0)
Lymphocytes Relative: 20 % (ref 12–46)
Lymphs Abs: 2.6 10*3/uL (ref 0.7–4.0)
MCH: 30.9 pg (ref 26.0–34.0)
MCHC: 34.3 g/dL (ref 30.0–36.0)
MCV: 90.1 fL (ref 78.0–100.0)
Monocytes Absolute: 1 10*3/uL (ref 0.1–1.0)
Monocytes Relative: 8 % (ref 3–12)
Neutro Abs: 9 10*3/uL — ABNORMAL HIGH (ref 1.7–7.7)
Neutrophils Relative %: 70 % (ref 43–77)
Platelets: 288 10*3/uL (ref 150–400)
RBC: 5.24 MIL/uL (ref 4.22–5.81)
RDW: 12.2 % (ref 11.5–15.5)
WBC: 12.8 10*3/uL — ABNORMAL HIGH (ref 4.0–10.5)

## 2011-12-31 LAB — COMPREHENSIVE METABOLIC PANEL
ALT: 44 U/L (ref 0–53)
Albumin: 4.4 g/dL (ref 3.5–5.2)
Alkaline Phosphatase: 72 U/L (ref 39–117)
Potassium: 3.9 mEq/L (ref 3.5–5.1)
Sodium: 142 mEq/L (ref 135–145)
Total Protein: 7.9 g/dL (ref 6.0–8.3)

## 2011-12-31 NOTE — ED Notes (Signed)
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  Hx of htn, but denies hx of htn, dbm or recent injury.  AO x 4.  Neuro intact.  Hands clammy, which pt state happens from time-to-time.

## 2012-01-01 ENCOUNTER — Emergency Department (HOSPITAL_COMMUNITY)
Admission: EM | Admit: 2012-01-01 | Discharge: 2012-01-01 | Disposition: A | Discharge status: Home / Self Care | Payer: Managed Care, Other (non HMO) | Attending: Emergency Medicine | Admitting: Emergency Medicine

## 2012-01-01 DIAGNOSIS — R200 Anesthesia of skin: Secondary | ICD-10-CM

## 2012-01-01 DIAGNOSIS — N289 Disorder of kidney and ureter, unspecified: Secondary | ICD-10-CM

## 2012-01-01 NOTE — ED Notes (Signed)
PT ambulated with baseline gait; VSS; A&Ox3; no signs of distress; respirations even and unlabored; skin warm and dry; no questions upon discharge.  

## 2012-01-01 NOTE — ED Provider Notes (Signed)
History     CSN: [id number]  Arrival date & time 12/31/11  2118   First MD Initiated Contact with Patient 01/01/12 0037      Chief Complaint  Patient presents with  . Numbness    (Consider location/radiation/quality/duration/timing/severity/associated sxs/prior treatment) HPI Comments: John Cisneros is a 31 y.o. Male who has had intermittent numbness in his hands and feet bilaterally for 2 days. There is no associated dizziness, weakness, nausea, vomiting, chest pain, or shortness of breath. He has never had this before. He sees his PCP regularly for treatment of blood pressure. No known aggravating or palliative factors.  The history is provided by the patient.    Past Medical History  Diagnosis Date  . Hypertension   . Kidney stone     History reviewed. No pertinent past surgical history.  Family History  Problem Relation Age of Onset  . Heart disease Maternal Grandmother   . Heart disease Maternal Grandfather   . Heart disease Paternal Grandmother   . Heart disease Paternal Grandfather     History  Substance Use Topics  . Smoking status: Current Everyday Smoker -- 0.5 packs/day    Types: Cigarettes  . Smokeless tobacco: Not on file  . Alcohol Use: 0.0 oz/week     occasional      Review of Systems  All other systems reviewed and are negative.    Allergies  Review of patient's allergies indicates no known allergies.  Home Medications   Current Outpatient Rx  Name Route Sig Dispense Refill  . AMLODIPINE BESYLATE 10 MG PO TABS Oral Take 1 tablet (10 mg total) by mouth daily. 90 tablet 1    BP 132/93  Pulse 89  Temp 98.9 F (37.2 C) (Oral)  Resp 18  SpO2 100%  Physical Exam  Nursing note and vitals reviewed. Constitutional: He is oriented to person, place, and time. He appears well-developed and well-nourished.  HENT:  Head: Normocephalic and atraumatic.  Right Ear: External ear normal.  Left Ear: External ear normal.  Eyes: Conjunctivae and  EOM are normal. Pupils are equal, round, and reactive to light.  Neck: Normal range of motion and phonation normal. Neck supple.  Cardiovascular: Normal rate, regular rhythm, normal heart sounds and intact distal pulses.   Pulmonary/Chest: Effort normal and breath sounds normal. He exhibits no bony tenderness.  Abdominal: Soft. Normal appearance. There is no tenderness.  Musculoskeletal: Normal range of motion.  Neurological: He is alert and oriented to person, place, and time. He has normal strength. No cranial nerve deficit or sensory deficit. He exhibits normal muscle tone. Coordination normal.       Normal gait. No ataxia. Romberg negative  Skin: Skin is warm, dry and intact.  Psychiatric: He has a normal mood and affect. His behavior is normal. Judgment and thought content normal.    ED Course  Procedures (including critical care time) Repeat blood pressure:    Labs Reviewed  CBC WITH DIFFERENTIAL - Abnormal; Notable for the following:    WBC 12.8 (*)     Neutro Abs 9.0 (*)     All other components within normal limits  COMPREHENSIVE METABOLIC PANEL - Abnormal; Notable for the following:    GFR calc non Af Amer 75 (*)     GFR calc Af Amer 87 (*)     All other components within normal limits  LAB REPORT - SCANNED   No results found.   1. Numbness   2. Renal insufficiency  MDM  Nonspecific, and intermittant, paresthesias, resolved; on admission to the emergency department. He has mild, hypertension, and decreased GFR from baseline. He is stable for discharge with outpatient management. Doubt ACS, CVA, severe metabolic instability. He likely needs blood pressure medication adjustment and reduction of risk factors for hypertension. This can be done by his PCP in the outpatient setting.     Plan: Home Medications- usual; Home Treatments- rest; Recommended follow up- PCP 1 week        Flint Melter, MD 01/01/12 [phone]

## 2012-01-25 ENCOUNTER — Encounter: Payer: Self-pay | Admitting: Family Medicine

## 2012-01-25 ENCOUNTER — Ambulatory Visit (INDEPENDENT_AMBULATORY_CARE_PROVIDER_SITE_OTHER): Payer: Managed Care, Other (non HMO) | Admitting: Family Medicine

## 2012-01-25 VITALS — BP 130/90 | HR 87 | Resp 15 | Ht 72.25 in | Wt 283.1 lb

## 2012-01-25 DIAGNOSIS — F17200 Nicotine dependence, unspecified, uncomplicated: Secondary | ICD-10-CM

## 2012-01-25 DIAGNOSIS — I10 Essential (primary) hypertension: Secondary | ICD-10-CM

## 2012-01-25 DIAGNOSIS — E669 Obesity, unspecified: Secondary | ICD-10-CM

## 2012-01-25 MED ORDER — AMLODIPINE BESYLATE 10 MG PO TABS: 10.0000 mg | ORAL_TABLET | Freq: Every day | ORAL | Status: DC

## 2012-01-25 NOTE — Progress Notes (Signed)
  Subjective:    Patient ID: John Cisneros, male    DOB: 31-Jan-1981, 31 y.o.   MRN: [id number]  HPI  Patient to followup high blood pressure. He is taking Norvasc 10 mg daily. He was seen in the ED when he fell his blood pressure was up. Headache and tingling all over. Workup was benign he was discharged home. His blood pressure of note was minimally elevated at the emergency room.  labs reviewed He occasionally gets tingling in his feet but overall this is improved. He's been trying to work out and play tennis to lose weight Review of Systems - per above   GEN- denies fatigue, fever, weight loss,weakness, recent illness HEENT- denies eye drainage, change in vision, nasal discharge, CVS- denies chest pain, palpitations RESP- denies SOB, cough, wheeze ABD- denies N/V, change in stools, abd pain GU- denies dysuria, hematuria, dribbling, incontinence Neuro- denies headache, dizziness, syncope, seizure activity      Objective:   Physical Exam GEN- NAD, alert and oriented x3 HEENT- PERRL, EOMI, non injected sclera, pink conjunctiva, MMM, oropharynx clear Neck- Supple,  CVS- RRR, no murmur RESP-CTAB ABD-NABS,soft,NT,ND EXT- No edema Pulses- Radial, DP- 2+ Neuro- CNII-XII in tact, no focal deficits, normal monofilament       Assessment & Plan:

## 2012-01-25 NOTE — Assessment & Plan Note (Signed)
unchanged

## 2012-01-25 NOTE — Assessment & Plan Note (Signed)
His miles weight was 190 pounds. His goal is to get to 225lbs. He was given information regarding diet and lifestyle changes. 1800-calorie she is well as healthy eating packets were given to patient

## 2012-01-25 NOTE — Assessment & Plan Note (Addendum)
Blood pressure overall has been well-controlled. His diastolic was elevated a little today. I'm not going to change his medications. I think if he exercise and lose his third or 40 pounds we may be able to start to titrate off this medication. I will check Urine for protein, if he has evidence of proteinuria will add low dose ACEI

## 2012-01-25 NOTE — Addendum Note (Signed)
Addended by: Milinda Antis F on: 01/25/2012 12:21 PM   Modules accepted: Orders

## 2012-01-25 NOTE — Patient Instructions (Signed)
Continue current medications  Call if you feel your blood pressure is up Continue your exercise  Read the handouts on diet/ calories  F/U 4 months

## 2012-04-13 ENCOUNTER — Ambulatory Visit: Payer: Managed Care, Other (non HMO) | Admitting: Family Medicine

## 2012-04-28 ENCOUNTER — Encounter (HOSPITAL_COMMUNITY): Payer: Self-pay | Admitting: *Deleted

## 2012-04-28 ENCOUNTER — Emergency Department (HOSPITAL_COMMUNITY)
Admission: EM | Admit: 2012-04-28 | Discharge: 2012-04-28 | Disposition: A | Discharge status: Home / Self Care | Payer: Self-pay | Attending: Emergency Medicine | Admitting: Emergency Medicine

## 2012-04-28 DIAGNOSIS — R5381 Other malaise: Secondary | ICD-10-CM | POA: Insufficient documentation

## 2012-04-28 DIAGNOSIS — Z87442 Personal history of urinary calculi: Secondary | ICD-10-CM | POA: Insufficient documentation

## 2012-04-28 DIAGNOSIS — M542 Cervicalgia: Secondary | ICD-10-CM | POA: Insufficient documentation

## 2012-04-28 DIAGNOSIS — F17200 Nicotine dependence, unspecified, uncomplicated: Secondary | ICD-10-CM | POA: Insufficient documentation

## 2012-04-28 DIAGNOSIS — B349 Viral infection, unspecified: Secondary | ICD-10-CM

## 2012-04-28 DIAGNOSIS — J3489 Other specified disorders of nose and nasal sinuses: Secondary | ICD-10-CM | POA: Insufficient documentation

## 2012-04-28 DIAGNOSIS — I10 Essential (primary) hypertension: Secondary | ICD-10-CM | POA: Insufficient documentation

## 2012-04-28 DIAGNOSIS — B9789 Other viral agents as the cause of diseases classified elsewhere: Secondary | ICD-10-CM | POA: Insufficient documentation

## 2012-04-28 DIAGNOSIS — Z79899 Other long term (current) drug therapy: Secondary | ICD-10-CM | POA: Insufficient documentation

## 2012-04-28 DIAGNOSIS — M436 Torticollis: Secondary | ICD-10-CM | POA: Insufficient documentation

## 2012-04-28 LAB — BASIC METABOLIC PANEL
CO2: 30 mEq/L (ref 19–32)
Calcium: 9.5 mg/dL (ref 8.4–10.5)
Chloride: 99 mEq/L (ref 96–112)
Creatinine, Ser: 1.17 mg/dL (ref 0.50–1.35)
Glucose, Bld: 100 mg/dL — ABNORMAL HIGH (ref 70–99)

## 2012-04-28 MED ORDER — ACETAMINOPHEN 325 MG PO TABS
650.0000 mg | ORAL_TABLET | Freq: Once | ORAL | Status: AC
  Administered 2012-04-28: 650 mg via ORAL
  Filled 2012-04-28: qty 1

## 2012-04-28 NOTE — ED Provider Notes (Signed)
History     CSN: [id number]  Arrival date & time 04/28/12  1733   First MD Initiated Contact with Patient 04/28/12 1743      Chief Complaint  Patient presents with  . Headache  . Torticollis    (Consider location/radiation/quality/duration/timing/severity/associated sxs/prior treatment) HPI Comments: John Cisneros is a 32 y.o. Male who presents with tenderness at his right posterior neck which started today and also has complaints of generalized fatigue and sensation of "pins and needles" in his hands and feet.  He also has tightness and a near muscle spasm sensation in his bilateral posterior thighs.  He denies fevers, chills and has had no dizziness, nausea, vomiting, and denies any neck stiffness.  His symptoms were present when he woke this am and he reports the posterior neck discomfort has now resolved.  He has taken no medications prior to arrival.  He has nasal congestion without sinus pain or pressure and no nasal drainage.       The history is provided by the patient and a parent.    Past Medical History  Diagnosis Date  . Hypertension   . Kidney stone     History reviewed. No pertinent past surgical history.  Family History  Problem Relation Age of Onset  . Heart disease Maternal Grandmother   . Heart disease Maternal Grandfather   . Heart disease Paternal Grandmother   . Heart disease Paternal Grandfather     History  Substance Use Topics  . Smoking status: Current Every Day Smoker -- 0.5 packs/day    Types: Cigarettes  . Smokeless tobacco: Not on file  . Alcohol Use: 0.0 oz/week     Comment: occasional      Review of Systems  Constitutional: Negative for fever and chills.  HENT: Positive for congestion and neck pain. Negative for sore throat, rhinorrhea, sneezing, neck stiffness and sinus pressure.   Eyes: Negative.  Negative for visual disturbance.  Respiratory: Negative for cough, chest tightness and shortness of breath.   Cardiovascular:  Negative for chest pain.  Gastrointestinal: Negative for nausea and abdominal pain.  Genitourinary: Negative.   Musculoskeletal: Negative for joint swelling and arthralgias.  Skin: Negative.  Negative for rash and wound.  Neurological: Negative for dizziness, weakness, light-headedness, numbness and headaches.  Hematological: Negative.   Psychiatric/Behavioral: Negative.     Allergies  Review of patient's allergies indicates no known allergies.  Home Medications   Current Outpatient Rx  Name  Route  Sig  Dispense  Refill  . AMLODIPINE BESYLATE 10 MG PO TABS   Oral   Take 1 tablet (10 mg total) by mouth daily.   90 tablet   1   . ADULT MULTIVITAMIN W/MINERALS CH   Oral   Take 1 tablet by mouth daily.           BP 153/91  Pulse 94  Temp 98.1 F (36.7 C) (Oral)  Resp 18  Ht 6' (1.829 m)  Wt 260 lb (117.935 kg)  BMI 35.26 kg/m2  SpO2 100%  Physical Exam  Constitutional: He is oriented to person, place, and time. He appears well-developed and well-nourished.  HENT:  Head: Normocephalic and atraumatic.  Right Ear: Tympanic membrane, external ear and ear canal normal.  Left Ear: Tympanic membrane, external ear and ear canal normal.  Nose: Mucosal edema and rhinorrhea present.  Mouth/Throat: Uvula is midline, oropharynx is clear and moist and mucous membranes are normal. No oropharyngeal exudate, posterior oropharyngeal edema, posterior oropharyngeal erythema or tonsillar abscesses.  Eyes: Conjunctivae normal and EOM are normal. Pupils are equal, round, and reactive to light.  Neck: Normal range of motion. Neck supple. No thyromegaly present.  Cardiovascular: Normal rate and normal heart sounds.   Pulmonary/Chest: Effort normal. No respiratory distress. He has no wheezes. He has no rales.  Abdominal: Soft. There is no tenderness.  Musculoskeletal: Normal range of motion.  Lymphadenopathy:       Head (right side): No posterior auricular and no occipital adenopathy  present.       Head (left side): No posterior auricular and no occipital adenopathy present.    He has no cervical adenopathy.  Neurological: He is alert and oriented to person, place, and time.  Skin: Skin is warm and dry. No rash noted.  Psychiatric: He has a normal mood and affect.    ED Course  Procedures (including critical care time)  Labs Reviewed  BASIC METABOLIC PANEL - Abnormal; Notable for the following:    Glucose, Bld 100 (*)     GFR calc non Af John 83 (*)     All other components within normal limits   No results found.   1. Viral syndrome       MDM  Patient with symptoms of unclear etiology,  No sig findings on exam. Pt possibly with early viral syndrome which he has been exposed to in family members.  Bmet normal.  Encouraged rest,  Fluids,  Tylenol or motrin for myalgias.  Recheck by pcp if sx change or become more focused.  The patient appears reasonably screened and/or stabilized for discharge and I doubt any other medical condition or other [facility] requiring further screening, evaluation, or treatment in the ED at this time prior to discharge.         Burgess Amor, [place] 04/28/12 2208

## 2012-04-28 NOTE — ED Notes (Addendum)
Pt reports headache in one spot on the back of his head/neck. Denies stiffness in neck, able to bend head and touch chin to chest without pain. Denies N/V/D. Pt has not taken anything for pain.

## 2012-04-29 NOTE — ED Provider Notes (Signed)
Medical screening examination/treatment/procedure(s) were performed by non-physician practitioner and as supervising physician I was immediately available for consultation/collaboration.   Glynn Octave, MD 04/29/12 [phone]

## 2012-05-22 ENCOUNTER — Ambulatory Visit (INDEPENDENT_AMBULATORY_CARE_PROVIDER_SITE_OTHER): Payer: BC Managed Care – PPO | Admitting: Family Medicine

## 2012-05-22 ENCOUNTER — Encounter: Payer: Self-pay | Admitting: Family Medicine

## 2012-05-22 ENCOUNTER — Other Ambulatory Visit (HOSPITAL_COMMUNITY)
Admission: RE | Admit: 2012-05-22 | Discharge: 2012-05-22 | Disposition: A | Discharge status: Home / Self Care | Payer: BC Managed Care – PPO | Source: Ambulatory Visit | Attending: Family Medicine | Admitting: Family Medicine

## 2012-05-22 VITALS — BP 148/84 | HR 88 | Resp 18 | Ht 72.25 in | Wt 291.1 lb

## 2012-05-22 DIAGNOSIS — R809 Proteinuria, unspecified: Secondary | ICD-10-CM | POA: Insufficient documentation

## 2012-05-22 DIAGNOSIS — I10 Essential (primary) hypertension: Secondary | ICD-10-CM

## 2012-05-22 DIAGNOSIS — J302 Other seasonal allergic rhinitis: Secondary | ICD-10-CM

## 2012-05-22 DIAGNOSIS — R3129 Other microscopic hematuria: Secondary | ICD-10-CM

## 2012-05-22 DIAGNOSIS — Z113 Encounter for screening for infections with a predominantly sexual mode of transmission: Secondary | ICD-10-CM | POA: Insufficient documentation

## 2012-05-22 DIAGNOSIS — E669 Obesity, unspecified: Secondary | ICD-10-CM

## 2012-05-22 DIAGNOSIS — N50819 Testicular pain, unspecified: Secondary | ICD-10-CM

## 2012-05-22 DIAGNOSIS — N509 Disorder of male genital organs, unspecified: Secondary | ICD-10-CM

## 2012-05-22 DIAGNOSIS — J309 Allergic rhinitis, unspecified: Secondary | ICD-10-CM

## 2012-05-22 DIAGNOSIS — F17200 Nicotine dependence, unspecified, uncomplicated: Secondary | ICD-10-CM

## 2012-05-22 LAB — POCT URINALYSIS DIPSTICK
Glucose, UA: NEGATIVE
Ketones, UA: NEGATIVE
Leukocytes, UA: NEGATIVE
Spec Grav, UA: 1.025
Urobilinogen, UA: 0.2

## 2012-05-22 MED ORDER — LISINOPRIL 10 MG PO TABS: 10.0000 mg | ORAL_TABLET | Freq: Every day | ORAL | Status: DC

## 2012-05-22 MED ORDER — LORATADINE 10 MG PO TABS: 10.0000 mg | ORAL_TABLET | Freq: Every day | ORAL | Status: DC

## 2012-05-22 NOTE — Assessment & Plan Note (Addendum)
Will add 10 mg of lisinopril urinalysis shows protein which he's had remotely in the past. His renal function was normal 2 weeks ago

## 2012-05-22 NOTE — Assessment & Plan Note (Signed)
Persistent hematuria he did not follow through with the previous urologist. I will refer him to [facility] he's had a CT scan earlier this year he needs workup for that persisted hematuria

## 2012-05-22 NOTE — Assessment & Plan Note (Signed)
Check urine microalbumin, start ACE inhibitor

## 2012-05-22 NOTE — Assessment & Plan Note (Signed)
Congratulated on tobacco cessation hopefully he will keep this up

## 2012-05-22 NOTE — Progress Notes (Signed)
  Subjective:    Patient ID: John Cisneros, male    DOB: 1980-07-14, 32 y.o.   MRN: [id number]  HPI  Patient here to followup chronic medical problems. He was seen in the emergency room for headache and his blood pressure was found to be quite elevated. He's been taking his amlodipine. He has gained 10 pounds since her last visit. He states that he recently joined the [facility] and he quit smoking 4 weeks ago therefore has been eating more. He's concerned about watering eyes and clear nasal drainage on and off for the past couple months and asking for something for his sinuses. He also has had sharp pains in his testicles on a few occasions. He denies any testicular swelling penile discharge he is sexually active but not in a monogamous relationship.  Review of Systems - per above    GEN- denies fatigue, fever, weight loss,weakness, recent illness HEENT- denies eye drainage, change in vision,+ nasal discharge, CVS- denies chest pain, palpitations RESP- denies SOB, cough, wheeze ABD- denies N/V, change in stools, abd pain GU- denies dysuria, hematuria, dribbling, incontinence MSK- denies joint pain, muscle aches, injury Neuro- denies headache, dizziness, syncope, seizure activity       Objective:   Physical Exam GEN- NAD, alert and oriented x3 HEENT- PERRL, EOMI, non injected sclera, pink conjunctiva, MMM, oropharynx clear Neck- Supple, CVS- RRR, no murmur RESP-CTAB ABD-NABS,soft,NT,ND GU- normal external genitalia, no penile lesion, bilateral testes descended, no pain on palpation or epidydymis, no erythema, no inguinal hernia EXT- No edema Pulses- Radial +2        Assessment & Plan:

## 2012-05-22 NOTE — Patient Instructions (Signed)
Your urine still shows protein and blood,  Start new blood pressure pill lisinopril take along with the amlodipine Start claritin for allergies We will call if any infection in urine F/U 8 weeks for blood pressure

## 2012-05-22 NOTE — Assessment & Plan Note (Signed)
We discussed his weight he is dedicated to exercise he is at risk for developing hyperlipidemia and type 2 diabetes

## 2012-05-22 NOTE — Assessment & Plan Note (Signed)
claritin

## 2012-05-22 NOTE — Assessment & Plan Note (Addendum)
Episodes of pain  were bilateral. Advised to wear support when he is exercising. I see no signs of infection urine GC and chlamydia have been sent his urinalysis does not show any evidence of infection.

## 2012-05-23 LAB — MICROALBUMIN / CREATININE URINE RATIO: Creatinine, Urine: 262.9 mg/dL

## 2012-05-30 ENCOUNTER — Ambulatory Visit: Payer: Managed Care, Other (non HMO) | Admitting: Family Medicine

## 2012-06-13 ENCOUNTER — Ambulatory Visit (INDEPENDENT_AMBULATORY_CARE_PROVIDER_SITE_OTHER): Payer: BC Managed Care – PPO | Admitting: Urology

## 2012-06-13 DIAGNOSIS — R3129 Other microscopic hematuria: Secondary | ICD-10-CM

## 2012-06-13 DIAGNOSIS — R82998 Other abnormal findings in urine: Secondary | ICD-10-CM

## 2012-07-17 ENCOUNTER — Ambulatory Visit: Payer: BC Managed Care – PPO | Admitting: Family Medicine

## 2012-07-25 ENCOUNTER — Other Ambulatory Visit: Payer: Self-pay

## 2012-07-25 MED ORDER — LISINOPRIL 10 MG PO TABS: 10.0000 mg | ORAL_TABLET | Freq: Every day | ORAL | Status: DC

## 2012-09-13 ENCOUNTER — Other Ambulatory Visit: Payer: Self-pay | Admitting: Family Medicine

## 2012-09-14 ENCOUNTER — Telehealth: Payer: Self-pay | Admitting: Family Medicine

## 2012-09-14 NOTE — Telephone Encounter (Signed)
Med refilled.

## 2012-10-17 ENCOUNTER — Ambulatory Visit: Payer: BC Managed Care – PPO | Admitting: Urology

## 2012-11-21 ENCOUNTER — Ambulatory Visit: Payer: BC Managed Care – PPO | Admitting: Urology

## 2013-01-19 ENCOUNTER — Ambulatory Visit (INDEPENDENT_AMBULATORY_CARE_PROVIDER_SITE_OTHER): Payer: BC Managed Care – PPO | Admitting: Family Medicine

## 2013-01-19 VITALS — BP 139/98 | HR 84 | Temp 98.4°F | Resp 20 | Ht 70.0 in | Wt 294.0 lb

## 2013-01-19 DIAGNOSIS — E669 Obesity, unspecified: Secondary | ICD-10-CM

## 2013-01-19 DIAGNOSIS — L42 Pityriasis rosea: Secondary | ICD-10-CM

## 2013-01-19 DIAGNOSIS — I10 Essential (primary) hypertension: Secondary | ICD-10-CM

## 2013-01-19 DIAGNOSIS — R21 Rash and other nonspecific skin eruption: Secondary | ICD-10-CM

## 2013-01-19 LAB — BASIC METABOLIC PANEL
BUN: 14 mg/dL (ref 6–23)
CO2: 28 mEq/L (ref 19–32)
Chloride: 103 mEq/L (ref 96–112)
Creat: 1.07 mg/dL (ref 0.50–1.35)

## 2013-01-19 LAB — CBC WITH DIFFERENTIAL/PLATELET
Basophils Absolute: 0 10*3/uL (ref 0.0–0.1)
Eosinophils Absolute: 0.2 10*3/uL (ref 0.0–0.7)
Hemoglobin: 15.4 g/dL (ref 13.0–17.0)
Lymphocytes Relative: 16 % (ref 12–46)
Lymphs Abs: 2.2 10*3/uL (ref 0.7–4.0)
MCH: 30.4 pg (ref 26.0–34.0)
Monocytes Relative: 10 % (ref 3–12)
Neutro Abs: 9.6 10*3/uL — ABNORMAL HIGH (ref 1.7–7.7)
Neutrophils Relative %: 72 % (ref 43–77)
Platelets: 340 10*3/uL (ref 150–400)
RBC: 5.07 MIL/uL (ref 4.22–5.81)
WBC: 13.2 10*3/uL — ABNORMAL HIGH (ref 4.0–10.5)

## 2013-01-19 MED ORDER — LORATADINE 10 MG PO TABS: 10.0000 mg | ORAL_TABLET | Freq: Every day | ORAL | Status: DC

## 2013-01-19 MED ORDER — CLOBETASOL PROPIONATE 0.05 % EX CREA: TOPICAL_CREAM | Freq: Two times a day (BID) | CUTANEOUS | Status: DC

## 2013-01-19 MED ORDER — LISINOPRIL 20 MG PO TABS: 20.0000 mg | ORAL_TABLET | Freq: Every day | ORAL | Status: DC

## 2013-01-19 MED ORDER — AMLODIPINE BESYLATE 10 MG PO TABS: ORAL_TABLET | ORAL | Status: DC

## 2013-01-19 NOTE — Patient Instructions (Signed)
Lisinopril increased to 20mg  Continue all other medications Use the topical steroid cream twice a day  Take Claritin once a day  F/U 8 weeks for blood pressure

## 2013-01-20 ENCOUNTER — Encounter: Payer: Self-pay | Admitting: Family Medicine

## 2013-01-20 LAB — RPR

## 2013-01-20 NOTE — Progress Notes (Signed)
  Subjective:    Patient ID: John Cisneros, male    DOB: 17-Sep-1980, 32 y.o.   MRN: [id number]  HPI  Pt here to f/u HTN and with rash. HTN- last visit in January 2014 has not followed up since then, at that visit lisinopril added due to uncontrolled HTN and proteinuria, has had some work-up by urology but failed to complete everything. Has not checked BP at home.  Rash- on his chest for past week or so. Stated as one spot then began to spread. Denies fever, pruritis, drainage from lesions, no sick contacts, no recent illness.    Review of Systems -Per above  GEN- denies fatigue, fever, weight loss,weakness, recent illness HEENT- denies eye drainage, change in vision, nasal discharge, CVS- denies chest pain, palpitations RESP- denies SOB, cough, wheeze ABD- denies N/V, change in stools, abd pain GU- denies dysuria, hematuria, dribbling, incontinence MSK- denies joint pain, muscle aches, injury Neuro- denies headache, dizziness, syncope, seizure activity      Objective:   Physical Exam GEN- NAD, alert and oriented x3 HEENT- PERRL, EOMI, non injected sclera, pink conjunctiva, MMM, oropharynx clear Neck- Supple, no LAD CVS- RRR, no murmur RESP-CTAB Skin- multiple erythematous collarette and plaque lesions with clearing in center but wrinkling of skin across chest, neck , smaller lesions on upper back, no vesicles no pustules EXT- No edema Pulses- Radial, DP- 2+        Assessment & Plan:

## 2013-01-20 NOTE — Assessment & Plan Note (Signed)
Uncontrolled HTN, increase lisinopril to 20mg  daily Next visit recheck renal function and UA for protein

## 2013-01-20 NOTE — Assessment & Plan Note (Signed)
Unchanged, discussed importance of healthy diet and weight loss

## 2013-01-20 NOTE — Assessment & Plan Note (Signed)
I think this is PR based on appearance though no recent illness. One lesion KOH performed no budding yeast I will check RPR as sexually new relationship Clobetasol given and anti-histamine Expect another couple of weeks before this resolves completely

## 2013-01-23 ENCOUNTER — Ambulatory Visit (INDEPENDENT_AMBULATORY_CARE_PROVIDER_SITE_OTHER): Payer: BC Managed Care – PPO | Admitting: Urology

## 2013-01-23 DIAGNOSIS — R3129 Other microscopic hematuria: Secondary | ICD-10-CM

## 2013-03-15 ENCOUNTER — Telehealth: Payer: Self-pay | Admitting: Family Medicine

## 2013-03-15 NOTE — Telephone Encounter (Signed)
Pt is calling to let us know that he has switched pharmacy's. He is now using [facility] in [place] If any questions call him at [phone]

## 2013-03-16 ENCOUNTER — Ambulatory Visit: Payer: BC Managed Care – PPO | Admitting: Family Medicine

## 2013-03-16 NOTE — Telephone Encounter (Signed)
noted 

## 2013-03-19 ENCOUNTER — Telehealth: Payer: Self-pay | Admitting: Family Medicine

## 2013-03-19 MED ORDER — AMLODIPINE BESYLATE 10 MG PO TABS: ORAL_TABLET | ORAL | Status: DC

## 2013-03-19 NOTE — Telephone Encounter (Signed)
Pt is calling because he is needing a refill on his Amlodipine he has changed pharmacys to [facility] on [address] in [place] Call back number is [phone]

## 2013-03-19 NOTE — Telephone Encounter (Signed)
Meds refilled.

## 2013-04-02 ENCOUNTER — Ambulatory Visit: Payer: BC Managed Care – PPO | Admitting: Family Medicine

## 2013-04-12 ENCOUNTER — Telehealth: Payer: Self-pay | Admitting: *Deleted

## 2013-04-13 MED ORDER — LORATADINE 10 MG PO TABS: 10.0000 mg | ORAL_TABLET | Freq: Every day | ORAL | Status: DC

## 2013-04-13 MED ORDER — LISINOPRIL 20 MG PO TABS: 20.0000 mg | ORAL_TABLET | Freq: Every day | ORAL | Status: DC

## 2013-04-13 NOTE — Telephone Encounter (Signed)
Meds refilled.

## 2013-07-04 ENCOUNTER — Encounter: Payer: Self-pay | Admitting: Family Medicine

## 2013-07-04 ENCOUNTER — Ambulatory Visit (INDEPENDENT_AMBULATORY_CARE_PROVIDER_SITE_OTHER): Payer: BC Managed Care – PPO | Admitting: Family Medicine

## 2013-07-04 VITALS — BP 148/82 | HR 84 | Temp 97.9°F | Resp 18 | Ht 71.0 in | Wt 307.0 lb

## 2013-07-04 DIAGNOSIS — E669 Obesity, unspecified: Secondary | ICD-10-CM

## 2013-07-04 DIAGNOSIS — I10 Essential (primary) hypertension: Secondary | ICD-10-CM

## 2013-07-04 MED ORDER — PHENTERMINE HCL 37.5 MG PO TABS: 37.5000 mg | ORAL_TABLET | Freq: Every day | ORAL | Status: DC

## 2013-07-04 MED ORDER — LISINOPRIL 20 MG PO TABS
20.0000 mg | ORAL_TABLET | Freq: Every day | ORAL | Status: DC
Start: 2013-07-04 — End: 2014-01-08

## 2013-07-04 MED ORDER — AMLODIPINE BESYLATE 10 MG PO TABS: ORAL_TABLET | ORAL | Status: DC

## 2013-07-04 MED ORDER — LORATADINE 10 MG PO TABS: 10.0000 mg | ORAL_TABLET | Freq: Every day | ORAL | Status: DC

## 2013-07-04 NOTE — Patient Instructions (Signed)
Take 1/2 tablet daily for phentermine for 2 weeks, then increase to 1 tablet Continue blood pressure medications LABS today  F/U 4 weeks for medications

## 2013-07-04 NOTE — Assessment & Plan Note (Signed)
He is morbidly obese in the setting of hypertension. At his young age he is at high risk for other comorbidities including early coronary artery disease and diabetes mellitus. Will start him on phentermine for weight loss. We discussed the medication in detail as well as an 1800-calorie diet and workout routine. He is provided with handouts for his diet we also discussed the side effects of the medication. He will followup in 4 weeks

## 2013-07-04 NOTE — Assessment & Plan Note (Signed)
Pressures elevated a little today. I will have him continue the lisinopril and amlodipine I think if we can get some weight loss down and improve his diet his blood pressure will significantly improve. I did go ahead and obtain nonfasting labs today

## 2013-07-04 NOTE — Progress Notes (Signed)
Patient ID: John MessierCortney T Cisneros, male   DOB: 1981/02/10, 33 y.o.   MRN: [id number]   Subjective:    Patient ID: John MessierCortney T Royce, male    DOB: 1981/02/10, 33 y.o.   MRN: [id number]  Patient presents for Medication Refill  Patient here for medication followup for his hypertension. He is concerned about his weight he has gained 13 pounds since her last visit. He knows that he overeats and eats. at the wrong time of day. He is requesting a dietary supplement to help him with his weight loss. He is also working out at the gym at least 3 days a week but has not seen any weight loss. He is becoming very frustrated and worried about his weight and his health. He does have diabetes mellitus in his grandparents.   Review Of Systems:  GEN- denies fatigue, fever, weight loss,weakness, recent illness HEENT- denies eye drainage, change in vision, nasal discharge, CVS- denies chest pain, palpitations RESP- denies SOB, cough, wheeze ABD- denies N/V, change in stools, abd pain GU- denies dysuria, hematuria, dribbling, incontinence MSK- denies joint pain, muscle aches, injury Neuro- denies headache, dizziness, syncope, seizure activity       Objective:    BP 148/82  Pulse 84  Temp(Src) 97.9 F (36.6 C)  Resp 18  Ht 5\' 11"  (1.803 m)  Wt 307 lb (139.254 kg)  BMI 42.84 kg/m2 GEN- NAD, alert and oriented x3 HEENT- PERRL, EOMI, non injected sclera, pink conjunctiva, MMM, oropharynx clear CVS- RRR, no murmur RESP-CTAB EXT- No edema Pulses- Radial, DP- 2+        Assessment & Plan:      Problem List Items Addressed This Visit   OBESITY NOS     He is morbidly obese in the setting of hypertension. At his young age he is at high risk for other comorbidities including early coronary artery disease and diabetes mellitus. Will start him on phentermine for weight loss. We discussed the medication in detail as well as an 1800-calorie diet and workout routine. He is provided with handouts for his diet we  also discussed the side effects of the medication. He will followup in 4 weeks    Relevant Medications      phentermine (ADIPEX-P) 37.5 MG tablet   HYPERTENSION - Primary     Pressures elevated a little today. I will have him continue the lisinopril and amlodipine I think if we can get some weight loss down and improve his diet his blood pressure will significantly improve. I did go ahead and obtain nonfasting labs today    Relevant Medications      amLODIpine (NORVASC) tablet      lisinopril (PRINIVIL,ZESTRIL) tablet   Other Relevant Orders      CBC with Differential      Comprehensive metabolic panel      Note: This dictation was prepared with Dragon dictation along with smaller phrase technology. Any transcriptional errors that result from this process are unintentional.

## 2013-07-05 LAB — COMPREHENSIVE METABOLIC PANEL
ALK PHOS: 69 U/L (ref 39–117)
ALT: 35 U/L (ref 0–53)
AST: 27 U/L (ref 0–37)
Albumin: 4.3 g/dL (ref 3.5–5.2)
BILIRUBIN TOTAL: 0.4 mg/dL (ref 0.2–1.2)
BUN: 13 mg/dL (ref 6–23)
CO2: 28 mEq/L (ref 19–32)
Calcium: 9.4 mg/dL (ref 8.4–10.5)
Chloride: 98 mEq/L (ref 96–112)
Creat: 1.01 mg/dL (ref 0.50–1.35)
Glucose, Bld: 79 mg/dL (ref 70–99)
POTASSIUM: 4.3 meq/L (ref 3.5–5.3)
SODIUM: 137 meq/L (ref 135–145)
Total Protein: 7.1 g/dL (ref 6.0–8.3)

## 2013-07-05 LAB — CBC WITH DIFFERENTIAL/PLATELET
BASOS ABS: 0 10*3/uL (ref 0.0–0.1)
Basophils Relative: 0 % (ref 0–1)
EOS PCT: 3 % (ref 0–5)
Eosinophils Absolute: 0.4 10*3/uL (ref 0.0–0.7)
HCT: 43.5 % (ref 39.0–52.0)
Hemoglobin: 14.7 g/dL (ref 13.0–17.0)
Lymphocytes Relative: 20 % (ref 12–46)
Lymphs Abs: 2.4 10*3/uL (ref 0.7–4.0)
MCH: 29.9 pg (ref 26.0–34.0)
MCHC: 33.8 g/dL (ref 30.0–36.0)
MCV: 88.4 fL (ref 78.0–100.0)
MONO ABS: 1.1 10*3/uL — AB (ref 0.1–1.0)
Monocytes Relative: 9 % (ref 3–12)
Neutro Abs: 8.2 10*3/uL — ABNORMAL HIGH (ref 1.7–7.7)
Neutrophils Relative %: 68 % (ref 43–77)
PLATELETS: 342 10*3/uL (ref 150–400)
RBC: 4.92 MIL/uL (ref 4.22–5.81)
RDW: 12.9 % (ref 11.5–15.5)
WBC: 12.1 10*3/uL — ABNORMAL HIGH (ref 4.0–10.5)

## 2013-07-10 ENCOUNTER — Encounter: Payer: Self-pay | Admitting: *Deleted

## 2013-08-01 ENCOUNTER — Encounter (HOSPITAL_COMMUNITY): Payer: Self-pay | Admitting: Emergency Medicine

## 2013-08-01 ENCOUNTER — Emergency Department (HOSPITAL_COMMUNITY)
Admission: EM | Admit: 2013-08-01 | Discharge: 2013-08-01 | Disposition: A | Discharge status: Home / Self Care | Payer: BC Managed Care – PPO | Attending: Emergency Medicine | Admitting: Emergency Medicine

## 2013-08-01 ENCOUNTER — Emergency Department (HOSPITAL_COMMUNITY): Payer: BC Managed Care – PPO

## 2013-08-01 DIAGNOSIS — Z79899 Other long term (current) drug therapy: Secondary | ICD-10-CM | POA: Insufficient documentation

## 2013-08-01 DIAGNOSIS — Z87442 Personal history of urinary calculi: Secondary | ICD-10-CM | POA: Insufficient documentation

## 2013-08-01 DIAGNOSIS — Z87891 Personal history of nicotine dependence: Secondary | ICD-10-CM | POA: Insufficient documentation

## 2013-08-01 DIAGNOSIS — R079 Chest pain, unspecified: Secondary | ICD-10-CM

## 2013-08-01 DIAGNOSIS — I10 Essential (primary) hypertension: Secondary | ICD-10-CM | POA: Insufficient documentation

## 2013-08-01 DIAGNOSIS — R0789 Other chest pain: Secondary | ICD-10-CM | POA: Insufficient documentation

## 2013-08-01 LAB — CBC
HEMATOCRIT: 43.7 % (ref 39.0–52.0)
Hemoglobin: 14.7 g/dL (ref 13.0–17.0)
MCH: 30.2 pg (ref 26.0–34.0)
MCHC: 33.6 g/dL (ref 30.0–36.0)
MCV: 89.9 fL (ref 78.0–100.0)
Platelets: 271 10*3/uL (ref 150–400)
RBC: 4.86 MIL/uL (ref 4.22–5.81)
RDW: 12.4 % (ref 11.5–15.5)
WBC: 11.5 10*3/uL — ABNORMAL HIGH (ref 4.0–10.5)

## 2013-08-01 LAB — BASIC METABOLIC PANEL
BUN: 16 mg/dL (ref 6–23)
CO2: 27 meq/L (ref 19–32)
Calcium: 9.3 mg/dL (ref 8.4–10.5)
Chloride: 99 mEq/L (ref 96–112)
Creatinine, Ser: 1.18 mg/dL (ref 0.50–1.35)
GFR calc Af Amer: >90 mL/min (ref >90)
GFR calc non Af Amer: 82 mL/min — ABNORMAL LOW (ref >90)
Glucose, Bld: 100 mg/dL — ABNORMAL HIGH (ref 70–99)
Potassium: 4.2 mEq/L (ref 3.7–5.3)
SODIUM: 138 meq/L (ref 137–147)

## 2013-08-01 LAB — I-STAT TROPONIN, ED: Troponin i, poc: 0.01 ng/mL (ref 0.00–0.08)

## 2013-08-01 NOTE — ED Provider Notes (Signed)
Medical screening examination/treatment/procedure(s) were performed by non-physician practitioner and as supervising physician I was immediately available for consultation/collaboration.   EKG Interpretation   Date/Time:  Wednesday August 01 2013 06:01:59 EDT Ventricular Rate:  74 PR Interval:  146 QRS Duration: 108 QT Interval:  389 QTC Calculation: 432 R Axis:   65 Text Interpretation:  Sinus rhythm Confirmed by Rhunette CroftNANAVATI, MD, Brionne Mertz  ([id number]) on 08/01/2013 7:02:47 AM       Derwood KaplanAnkit Raiya Stainback, MD 08/01/13 [id number]

## 2013-08-01 NOTE — Discharge Instructions (Signed)
Chest Pain (Nonspecific) °Chest pain has many causes. Your pain could be caused by something serious, such as a heart attack or a blood clot in the lungs. It could also be caused by something less serious, such as a chest bruise or a virus. Follow up with your doctor. More lab tests or other studies may be needed to find the cause of your pain. Most of the time, nonspecific chest pain will improve within 2 to 3 days of rest and mild pain medicine. °HOME CARE °· For chest bruises, you may put ice on the sore area for 15-20 minutes, 03-04 times a day. Do this only if it makes you feel better. °· Put ice in a plastic bag. °· Place a towel between the skin and the bag. °· Rest for the next 2 to 3 days. °· Go back to work if the pain improves. °· See your doctor if the pain lasts longer than 1 to 2 weeks. °· Only take medicine as told by your doctor. °· Quit smoking if you smoke. °GET HELP RIGHT AWAY IF:  °· There is more pain or pain that spreads to the arm, neck, jaw, back, or belly (abdomen). °· You have shortness of breath. °· You cough more than usual or cough up blood. °· You have very bad back or belly pain, feel sick to your stomach (nauseous), or throw up (vomit). °· You have very bad weakness. °· You pass out (faint). °· You have a fever. °Any of these problems may be serious and may be an emergency. Do not wait to see if the problems will go away. Get medical help right away. Call your local emergency services 911 in [place] Do not drive yourself to the hospital. °MAKE SURE YOU:  °· Understand these instructions. °· Will watch this condition. °· Will get help right away if you or your child is not doing well or gets worse. °Document Released: 09/29/2007 Document Revised: 07/05/2011 Document Reviewed: 09/29/2007 °[employer]® Patient Information ©2014 [employer]. ° °

## 2013-08-01 NOTE — ED Provider Notes (Signed)
CSN: [id number]     Arrival date & time 08/01/13  0556 History   First MD Initiated Contact with Patient 08/01/13 [phone]     Chief Complaint  Patient presents with  . Chest Pain    onset 1 hour ago     (Consider location/radiation/quality/duration/timing/severity/associated sxs/prior Treatment) HPI Pt is a 33yo male with hx of HTN presenting to ED c/o right sided chest pain onset 1 hour PTA. Pt states pain was sharp in nature in right side of chest, able to pin-point area of pain. Pain only lasted a few seconds at a time, woke pt from his sleeping, 6/10 at worst. Did not radiate. Pt states pain has resolved. States "I think it's something I ate."   Denies SOB, nausea, vomiting, or diaphoresis. Denies recent travel, hx of blood clots, leg pain or swelling.  Reports similar episodes "several years ago."  Denies known personal cardiac hx. Pt does report family hx of CAD, however denies hx of early MIs, before age of 10.  Pt does have a PCP, Dr. Jeanice Lim, who pt states he goes to regularly.    Past Medical History  Diagnosis Date  . Hypertension   . Kidney stone    History reviewed. No pertinent past surgical history. Family History  Problem Relation Age of Onset  . Heart disease Maternal Grandmother   . Heart disease Maternal Grandfather   . Heart disease Paternal Grandmother   . Heart disease Paternal Grandfather    History  Substance Use Topics  . Smoking status: Former Smoker -- 0.50 packs/day    Types: Cigarettes  . Smokeless tobacco: Not on file  . Alcohol Use: 0.0 oz/week     Comment: occasional    Review of Systems  Constitutional: Negative for fever and chills.  Respiratory: Negative for shortness of breath.   Cardiovascular: Positive for chest pain. Negative for palpitations and leg swelling.  Gastrointestinal: Negative for nausea, vomiting, abdominal pain and diarrhea.  All other systems reviewed and are negative.     Allergies  Review of patient's allergies indicates  no known allergies.  Home Medications   Current Outpatient Rx  Name  Route  Sig  Dispense  Refill  . amLODipine (NORVASC) 10 MG tablet      TAKE ONE TABLET BY MOUTH EVERY DAY   90 tablet   1   . lisinopril (PRINIVIL,ZESTRIL) 20 MG tablet   Oral   Take 1 tablet (20 mg total) by mouth daily.   90 tablet   1   . loratadine (CLARITIN) 10 MG tablet   Oral   Take 1 tablet (10 mg total) by mouth daily.   30 tablet   3    BP 131/85  Pulse 86  Temp(Src) 98.1 F (36.7 C) (Oral)  Resp 19  Ht 6' (1.829 m)  Wt 280 lb (127.007 kg)  BMI 37.97 kg/m2  SpO2 99% Physical Exam  Nursing note and vitals reviewed. Constitutional: He appears well-developed and well-nourished.  Pt lying comfortably in exam bed, watching television, NAD  HENT:  Head: Normocephalic and atraumatic.  Eyes: Conjunctivae are normal. No scleral icterus.  Neck: Normal range of motion.  Cardiovascular: Normal rate, regular rhythm and normal heart sounds.   Pulmonary/Chest: Effort normal and breath sounds normal. No respiratory distress. He has no wheezes. He has no rales. He exhibits no tenderness.  No respiratory distress, able to speak in full sentences w/o difficulty. Lungs: CTAB. No chest wall tenderness.  Abdominal: Soft. Bowel sounds are normal.  He exhibits no distension and no mass. There is no tenderness. There is no rebound and no guarding.  Musculoskeletal: Normal range of motion.  Neurological: He is alert.  Skin: Skin is warm and dry.    ED Course  Procedures (including critical care time) Labs Review Labs Reviewed  CBC - Abnormal; Notable for the following:    WBC 11.5 (*)    All other components within normal limits  BASIC METABOLIC PANEL - Abnormal; Notable for the following:    Glucose, Bld 100 (*)    GFR calc non Af Amer 82 (*)    All other components within normal limits  Rosezena SensorI-STAT TROPOININ, ED   Imaging Review Dg Chest Port 1 View  08/01/2013   CLINICAL DATA:  Sudden onset of mid chest  pain.  EXAM: PORTABLE CHEST - 1 VIEW  COMPARISON:  Chest radiograph performed 04/23/2011  FINDINGS: The lungs are well-aerated. Pulmonary vascularity is at the upper limits of normal. There is no evidence of focal opacification, pleural effusion or pneumothorax.  The cardiomediastinal silhouette is mildly enlarged. No acute osseous abnormalities are seen.  IMPRESSION: Mild cardiomegaly; lungs remain grossly clear.   Electronically Signed   By: Roanna RaiderJeffery  Chang M.D.   On: 08/01/2013 06:30     EKG Interpretation   Date/Time:  Wednesday August 01 2013 06:01:59 EDT Ventricular Rate:  74 PR Interval:  146 QRS Duration: 108 QT Interval:  389 QTC Calculation: 432 R Axis:   65 Text Interpretation:  Sinus rhythm Confirmed by Rhunette CroftNANAVATI, MD, Janey GentaANKIT  [phone]([id number] on 08/01/2013 7:02:47 AM      MDM   Final diagnoses:  Chest pain    Pt is a 33yo male presenting with atypical chest pain, not concerned for ACS. PERC negative. Pain may be musculoskeletal in nature or possibly due to stress/anxiety.  Labs: CBC, BMP, istat troponin: unremarkable.  CXR: mild cardiomegaly, lungs remain grossly clear.  EKG-NSR.  Pt states his pain resolved after being in ED c/o medications.  Do not believe further testing is needed at this time.  Pt states he feels comfortable being discharged home.  Advised pt to f/u with his PCP, Dr. Jeanice Limurham later this week for recheck of symptoms. Return precautions provided. Pt verbalized understanding and agreement with tx plan.  Discussed pt with Dr. Rhunette CroftNanavati who agrees with plan.     Junius FinnerErin O'Malley, PA-C 08/01/13 [phone]

## 2013-08-01 NOTE — ED Notes (Signed)
Patient c/o chest pain, right sided, onset 1 hour ago. Denies any SOB or radiation. Describes pain as sharp

## 2013-10-16 ENCOUNTER — Ambulatory Visit (INDEPENDENT_AMBULATORY_CARE_PROVIDER_SITE_OTHER): Payer: BC Managed Care – PPO | Admitting: Family Medicine

## 2013-10-16 ENCOUNTER — Ambulatory Visit (INDEPENDENT_AMBULATORY_CARE_PROVIDER_SITE_OTHER): Payer: BC Managed Care – PPO

## 2013-10-16 VITALS — BP 124/82 | HR 84 | Temp 98.1°F | Resp 16 | Ht 70.5 in | Wt 287.5 lb

## 2013-10-16 DIAGNOSIS — M25531 Pain in right wrist: Secondary | ICD-10-CM

## 2013-10-16 DIAGNOSIS — M25539 Pain in unspecified wrist: Secondary | ICD-10-CM

## 2013-10-16 DIAGNOSIS — T148XXA Other injury of unspecified body region, initial encounter: Secondary | ICD-10-CM

## 2013-10-16 NOTE — Progress Notes (Signed)
   Subjective:    Patient ID: John Cisneros, male    DOB: [birth year], 33 y.o.   MRN: [id number]  HPI Fall on right forearm playing basketball last night.  Pain in forearm and difficulty with sleeping last night secondary to pain.  No change in ROM.  No radiation of pain.  Denies swelling.  Continued pain after Advil.  PPMH:  Hypertension  SH:  Former smoker   Review of Systems As per HPI, otherwise negative.    Objective:   Physical Exam Blood pressure 124/82, pulse 84, temperature 98.1 F (36.7 C), temperature source Oral, resp. rate 16, height 5' 10.5" (1.791 m), weight 287 lb 8 oz (130.409 kg), SpO2 97.00%. Body mass index is 40.66 kg/(m^2). Well-developed, well nourished male who is awake, alert and oriented, in NAD. HEENT: [place]/AT, PERRL, EOMI.  Sclera and conjunctiva are clear.  Neck: FROM Lungs: normal effort Extremities: TTP right upper forearm, FROM right elbow and right wrist with no decrease in ROM. Skin: warm and dry without rash. Psychologic: good mood and appropriate affect, normal speech and behavior.   Xray forearm:  No fracture.    Assessment & Plan:  Arm contusion  ACE for compression. Recommended 800 Ibuprofen (4x200mg ) before bed and as needed Q8hours for next 2 days. Return if worsen.

## 2013-10-16 NOTE — Progress Notes (Signed)
Xray read and patient discussed with Dr. Lorri FrederickMcGrath. Agree with assessment and plan of care per his note, and XR report noted.

## 2014-01-08 ENCOUNTER — Ambulatory Visit (INDEPENDENT_AMBULATORY_CARE_PROVIDER_SITE_OTHER): Payer: BC Managed Care – PPO | Admitting: Family Medicine

## 2014-01-08 ENCOUNTER — Encounter: Payer: Self-pay | Admitting: Family Medicine

## 2014-01-08 ENCOUNTER — Other Ambulatory Visit: Payer: Self-pay | Admitting: Family Medicine

## 2014-01-08 VITALS — BP 130/76 | HR 78 | Temp 98.3°F | Resp 14 | Ht 71.0 in | Wt 276.0 lb

## 2014-01-08 DIAGNOSIS — J302 Other seasonal allergic rhinitis: Secondary | ICD-10-CM

## 2014-01-08 DIAGNOSIS — J309 Allergic rhinitis, unspecified: Secondary | ICD-10-CM

## 2014-01-08 DIAGNOSIS — J069 Acute upper respiratory infection, unspecified: Secondary | ICD-10-CM

## 2014-01-08 DIAGNOSIS — L600 Ingrowing nail: Secondary | ICD-10-CM | POA: Insufficient documentation

## 2014-01-08 DIAGNOSIS — I10 Essential (primary) hypertension: Secondary | ICD-10-CM

## 2014-01-08 DIAGNOSIS — E669 Obesity, unspecified: Secondary | ICD-10-CM

## 2014-01-08 LAB — CBC WITH DIFFERENTIAL/PLATELET
BASOS PCT: 0 % (ref 0–1)
Basophils Absolute: 0 10*3/uL (ref 0.0–0.1)
EOS PCT: 5 % (ref 0–5)
Eosinophils Absolute: 0.7 10*3/uL (ref 0.0–0.7)
HEMATOCRIT: 43.7 % (ref 39.0–52.0)
HEMOGLOBIN: 14.6 g/dL (ref 13.0–17.0)
Lymphocytes Relative: 14 % (ref 12–46)
Lymphs Abs: 1.8 10*3/uL (ref 0.7–4.0)
MCH: 30.2 pg (ref 26.0–34.0)
MCHC: 33.4 g/dL (ref 30.0–36.0)
MCV: 90.5 fL (ref 78.0–100.0)
MONO ABS: 1.3 10*3/uL — AB (ref 0.1–1.0)
Monocytes Relative: 10 % (ref 3–12)
NEUTROS ABS: 9.2 10*3/uL — AB (ref 1.7–7.7)
Neutrophils Relative %: 71 % (ref 43–77)
Platelets: 316 10*3/uL (ref 150–400)
RBC: 4.83 MIL/uL (ref 4.22–5.81)
RDW: 12.7 % (ref 11.5–15.5)
WBC: 13 10*3/uL — ABNORMAL HIGH (ref 4.0–10.5)

## 2014-01-08 LAB — BASIC METABOLIC PANEL
BUN: 13 mg/dL (ref 6–23)
CHLORIDE: 102 meq/L (ref 96–112)
CO2: 25 mEq/L (ref 19–32)
Calcium: 9.5 mg/dL (ref 8.4–10.5)
Creat: 1.06 mg/dL (ref 0.50–1.35)
Glucose, Bld: 72 mg/dL (ref 70–99)
POTASSIUM: 4.3 meq/L (ref 3.5–5.3)
Sodium: 139 mEq/L (ref 135–145)

## 2014-01-08 MED ORDER — LORATADINE 10 MG PO TABS: 10.0000 mg | ORAL_TABLET | Freq: Every day | ORAL | Status: DC

## 2014-01-08 MED ORDER — SULFAMETHOXAZOLE-TMP DS 800-160 MG PO TABS: 1.0000 | ORAL_TABLET | Freq: Two times a day (BID) | ORAL | Status: DC

## 2014-01-08 MED ORDER — AMLODIPINE BESYLATE 10 MG PO TABS: ORAL_TABLET | ORAL | Status: DC

## 2014-01-08 MED ORDER — LISINOPRIL 20 MG PO TABS: 20.0000 mg | ORAL_TABLET | Freq: Every day | ORAL | Status: DC

## 2014-01-08 MED ORDER — FLUTICASONE PROPIONATE 50 MCG/ACT NA SUSP: 2.0000 | Freq: Every day | NASAL | Status: DC

## 2014-01-08 NOTE — Patient Instructions (Signed)
Take antibiotics as prescribed, soak foot in epson salt Use flonase and mucinex DM No Decongestants Continue blood pressure medication F/U 2 months PHYSICAL FASTING

## 2014-01-08 NOTE — Assessment & Plan Note (Signed)
An infected nail fold with an ingrown toenail. It does not look severe enough for me to remove the nail I think we can put him on some antibiotics and use Epson salt soaks and save the nail at this time. We discussed proper nail trimming

## 2014-01-08 NOTE — Assessment & Plan Note (Signed)
Intentional weight loss noted he will continue with his weight loss program goal is to get his weight down 225lbs

## 2014-01-08 NOTE — Assessment & Plan Note (Signed)
Viral respiratory infection. He can use Mucinex DM he also gets seasonal allergies in the fall so that her Flonase to his loratadine he can start this now secondary to the nasal congestion

## 2014-01-08 NOTE — Telephone Encounter (Signed)
Refill appropriate and filled per protocol. 

## 2014-01-08 NOTE — Assessment & Plan Note (Signed)
Blood pressure looks good today no change in medications I will check his renal function

## 2014-01-08 NOTE — Progress Notes (Signed)
Patient ID: John Cisneros, male   DOB: Oct 23, 1980, 33 y.o.   MRN: [id number]   Subjective:    Patient ID: John Cisneros, male    DOB: [birth year], 33 y.o.   MRN: [id number]  Patient presents for Illness, R Great Toe and F/U HTN   patient is here to followup hypertension. He is intentionally lost about 30 pounds he's been working out for 5 days a week with cardio and also watching his diet. He states his blood pressure medicine as prescribed. The past 2 days he has had some cough stuffy nose with minimal production he denies any fever. He is also has swelling and redness of his right great toe he states that there was a nail and some skin that he pulled off a week or so ago but it did not turn red until recently. He's not had any drainage from the toe.    Review Of Systems:  GEN- denies fatigue, fever, weight loss,weakness, recent illness HEENT- denies eye drainage, change in vision, +nasal discharge, CVS- denies chest pain, palpitations RESP- denies SOB, +cough, wheeze ABD- denies N/V, change in stools, abd pain GU- denies dysuria, hematuria, dribbling, incontinence MSK- denies joint pain, muscle aches, injury Neuro- denies headache, dizziness, syncope, seizure activity       Objective:    BP 130/76  Pulse 78  Temp(Src) 98.3 F (36.8 C) (Oral)  Resp 14  Ht  (1.803 m)  Wt 276 lb (125.193 kg)  BMI 38.51 kg/m2 GEN- NAD, alert and oriented x3 HEENT- PERRL, EOMI, non injected sclera, pink conjunctiva, MMM, oropharynx mild injection, TM with wax obscurring, canal clear, no maxillary sinus tenderness, +inflammed turbinates,+  Nasal drainage  Neck- Supple, no LAD CVS- RRR, no murmur RESP-CTAB EXT- Right great toe- erythema medial edge and inferior nailfold, no fluctance, mild swelling, ingrown nail medial aspect, callus of foot Pulses- Radial 2+, DP 2+          Assessment & Plan:      Problem List Items Addressed This Visit   Viral URI   Relevant Medications    sulfamethoxazole-trimethoprim (BACTRIM/SEPTRA DS) tablet 800-160 mg   Seasonal allergies   Ingrown toenail   HYPERTENSION - Primary   Relevant Medications      lisinopril (PRINIVIL,ZESTRIL) tablet      amLODIpine (NORVASC) tablet      Note: This dictation was prepared with Dragon dictation along with smaller phrase technology. Any transcriptional errors that result from this process are unintentional.

## 2014-01-11 ENCOUNTER — Encounter: Payer: Self-pay | Admitting: *Deleted

## 2014-07-09 ENCOUNTER — Ambulatory Visit (INDEPENDENT_AMBULATORY_CARE_PROVIDER_SITE_OTHER): Payer: BLUE CROSS/BLUE SHIELD | Admitting: Family Medicine

## 2014-07-09 ENCOUNTER — Encounter: Payer: Self-pay | Admitting: Family Medicine

## 2014-07-09 VITALS — BP 122/78 | HR 84 | Temp 99.2°F | Resp 18 | Wt 275.0 lb

## 2014-07-09 DIAGNOSIS — J302 Other seasonal allergic rhinitis: Secondary | ICD-10-CM | POA: Diagnosis not present

## 2014-07-09 DIAGNOSIS — E669 Obesity, unspecified: Secondary | ICD-10-CM | POA: Diagnosis not present

## 2014-07-09 DIAGNOSIS — I10 Essential (primary) hypertension: Secondary | ICD-10-CM | POA: Diagnosis not present

## 2014-07-09 MED ORDER — CETIRIZINE HCL 10 MG PO TABS: 10.0000 mg | ORAL_TABLET | Freq: Every day | ORAL | Status: DC

## 2014-07-09 NOTE — Progress Notes (Signed)
Patient ID: John MessierCortney T Cisneros, male   DOB: 1981/03/31, 34 y.o.   MRN: [id number]   Subjective:    Patient ID: John MessierCortney T Cisneros, male    DOB: 1981/03/31, 34 y.o.   MRN: [id number]  Patient presents for check up/med refills  Pt here to f/u meds, he has mild abdominal pain states felt like knots but went away on its own for a few days. He did take tums 2 days. He denies fever, N/V, diarrhea.  Feels well now.   HTN- taking BP meds as prescribed no side effects, he is now working out 2 days a week but plans to increase back to 5 days a week.  Getting married in September    Review Of Systems:  GEN- denies fatigue, fever, weight loss,weakness, recent illness HEENT- denies eye drainage, change in vision, nasal discharge, CVS- denies chest pain, palpitations RESP- denies SOB, cough, wheeze ABD- denies N/V, change in stools, abd pain GU- denies dysuria, hematuria, dribbling, incontinence MSK- denies joint pain, muscle aches, injury Neuro- denies headache, dizziness, syncope, seizure activity       Objective:    BP 122/78 mmHg  Pulse 84  Temp(Src) 99.2 F (37.3 C) (Oral)  Resp 18  Wt 275 lb (124.739 kg) GEN- NAD, alert and oriented x3, obese HEENT- PERRL, EOMI, non injected sclera, pink conjunctiva, MMM, oropharynx clear CVS- RRR, no murmur RESP-CTAB ABD-NABS,soft,NT,ND EXT- No edema Pulses- Radial,- 2+        Assessment & Plan:      Problem List Items Addressed This Visit      Unprioritized   Obesity - Primary   Essential hypertension      Note: This dictation was prepared with Dragon dictation along with smaller phrase technology. Any transcriptional errors that result from this process are unintentional.

## 2014-07-09 NOTE — Assessment & Plan Note (Signed)
Well controlled, if he is able to get more weight off, would like to decrease his BP medications and see if he can manage with weight control and diet

## 2014-07-09 NOTE — Patient Instructions (Addendum)
Try zantac if needed Avoid spicey foods Continue current medications F/U 3 months- PHYSICAL -FASTING

## 2014-07-09 NOTE — Assessment & Plan Note (Signed)
Requested allergy med refill, but asked for something different than claritin Trial of zyrtec

## 2014-07-10 ENCOUNTER — Telehealth: Payer: Self-pay | Admitting: *Deleted

## 2014-07-10 NOTE — Telephone Encounter (Signed)
Use the miralax, increase water and fiber. If he gets has more rectal bleeding or severe pain then make visit

## 2014-07-10 NOTE — Telephone Encounter (Signed)
Pt called stating he was here yesterday with BP issues and Stomach issues, called to inform you that he is having light red blood in his stools, pt states he is straining and feels constipated. I advised pt to take OTC Miralax 1 capful to see if that will help and if provider has any other recommendation we will call him. Pt wants to know if there is anything else he can do in the mean time. Please advise!  [facility] [phone] - [place], [place] - [address] AT [address]

## 2014-07-11 NOTE — Telephone Encounter (Signed)
lmtrc

## 2014-07-11 NOTE — Telephone Encounter (Signed)
Pt called back and aware of message. °

## 2014-07-12 ENCOUNTER — Other Ambulatory Visit: Payer: Self-pay | Admitting: Family Medicine

## 2014-07-15 NOTE — Telephone Encounter (Signed)
Refill appropriate and filled per protocol. 

## 2014-09-25 ENCOUNTER — Emergency Department (HOSPITAL_COMMUNITY)
Admission: EM | Admit: 2014-09-25 | Discharge: 2014-09-25 | Disposition: A | Discharge status: Home / Self Care | Payer: BLUE CROSS/BLUE SHIELD | Attending: Emergency Medicine | Admitting: Emergency Medicine

## 2014-09-25 ENCOUNTER — Encounter (HOSPITAL_COMMUNITY): Payer: Self-pay | Admitting: *Deleted

## 2014-09-25 DIAGNOSIS — Z7951 Long term (current) use of inhaled steroids: Secondary | ICD-10-CM | POA: Diagnosis not present

## 2014-09-25 DIAGNOSIS — Z87442 Personal history of urinary calculi: Secondary | ICD-10-CM | POA: Diagnosis not present

## 2014-09-25 DIAGNOSIS — I10 Essential (primary) hypertension: Secondary | ICD-10-CM | POA: Insufficient documentation

## 2014-09-25 DIAGNOSIS — Z87891 Personal history of nicotine dependence: Secondary | ICD-10-CM | POA: Insufficient documentation

## 2014-09-25 DIAGNOSIS — Z79899 Other long term (current) drug therapy: Secondary | ICD-10-CM | POA: Diagnosis not present

## 2014-09-25 DIAGNOSIS — M545 Low back pain: Secondary | ICD-10-CM | POA: Diagnosis not present

## 2014-09-25 DIAGNOSIS — R109 Unspecified abdominal pain: Secondary | ICD-10-CM | POA: Insufficient documentation

## 2014-09-25 LAB — URINE MICROSCOPIC-ADD ON

## 2014-09-25 LAB — URINALYSIS, ROUTINE W REFLEX MICROSCOPIC
Bilirubin Urine: NEGATIVE
Glucose, UA: NEGATIVE mg/dL
Ketones, ur: NEGATIVE mg/dL
Leukocytes, UA: NEGATIVE
Nitrite: NEGATIVE
Protein, ur: NEGATIVE mg/dL
Specific Gravity, Urine: 1.025 (ref 1.005–1.030)
UROBILINOGEN UA: 1 mg/dL (ref 0.0–1.0)
pH: 5.5 (ref 5.0–8.0)

## 2014-09-25 LAB — CBC WITH DIFFERENTIAL/PLATELET
Basophils Absolute: 0 10*3/uL (ref 0.0–0.1)
Basophils Relative: 0 % (ref 0–1)
EOS ABS: 0.2 10*3/uL (ref 0.0–0.7)
EOS PCT: 2 % (ref 0–5)
HEMATOCRIT: 44.1 % (ref 39.0–52.0)
HEMOGLOBIN: 15 g/dL (ref 13.0–17.0)
Lymphocytes Relative: 23 % (ref 12–46)
Lymphs Abs: 2.5 10*3/uL (ref 0.7–4.0)
MCH: 30.9 pg (ref 26.0–34.0)
MCHC: 34 g/dL (ref 30.0–36.0)
MCV: 90.7 fL (ref 78.0–100.0)
MONO ABS: 0.9 10*3/uL (ref 0.1–1.0)
Monocytes Relative: 8 % (ref 3–12)
Neutro Abs: 7.2 10*3/uL (ref 1.7–7.7)
Neutrophils Relative %: 67 % (ref 43–77)
Platelets: 309 10*3/uL (ref 150–400)
RBC: 4.86 MIL/uL (ref 4.22–5.81)
RDW: 12.1 % (ref 11.5–15.5)
WBC: 10.9 10*3/uL — AB (ref 4.0–10.5)

## 2014-09-25 LAB — COMPREHENSIVE METABOLIC PANEL
ALBUMIN: 4.1 g/dL (ref 3.5–5.0)
ALT: 29 U/L (ref 17–63)
ANION GAP: 10 (ref 5–15)
AST: 31 U/L (ref 15–41)
Alkaline Phosphatase: 61 U/L (ref 38–126)
BUN: 13 mg/dL (ref 6–20)
CO2: 26 mmol/L (ref 22–32)
CREATININE: 1.22 mg/dL (ref 0.61–1.24)
Calcium: 9 mg/dL (ref 8.9–10.3)
Chloride: 102 mmol/L (ref 101–111)
GFR calc Af Amer: >60 mL/min (ref >60)
GFR calc non Af Amer: >60 mL/min (ref >60)
Glucose, Bld: 77 mg/dL (ref 65–99)
Potassium: 3.9 mmol/L (ref 3.5–5.1)
Sodium: 138 mmol/L (ref 135–145)
Total Bilirubin: 0.5 mg/dL (ref 0.3–1.2)
Total Protein: 7.3 g/dL (ref 6.5–8.1)

## 2014-09-25 MED ORDER — CYCLOBENZAPRINE HCL 10 MG PO TABS: 10.0000 mg | ORAL_TABLET | Freq: Two times a day (BID) | ORAL | Status: DC | PRN

## 2014-09-25 MED ORDER — NAPROXEN 500 MG PO TABS: 500.0000 mg | ORAL_TABLET | Freq: Two times a day (BID) | ORAL | Status: DC

## 2014-09-25 NOTE — ED Notes (Signed)
EDP at bedside  

## 2014-09-25 NOTE — ED Notes (Signed)
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  Pt sts he does weight lifting and the last time he worked out was last week. Sts the lower back pain started over the weekend.

## 2014-09-25 NOTE — ED Provider Notes (Signed)
CSN: [id number]     Arrival date & time 09/25/14  1809 History   First MD Initiated Contact with Patient 09/25/14 1955     Chief Complaint  Patient presents with  . Back Pain  . Abdominal Pain     (Consider location/radiation/quality/duration/timing/severity/associated sxs/prior Treatment) HPI Comments: The patient is a 34 year old male, he has a history of hypertension and a distant history of kidney stones, he presents with approximately 3 days of lower back pain. He states that at work he uses a Chief Executive Officer, he does exercise, he weight lifts, he remembers the pain coming on gradually, it is in the mid lower back, there is no radiation to the abdomen, it does not radiate up his spine and he has no associated fevers chills nausea vomiting dysuria diarrhea incontinence urinary retention, IV drug use, cancer, numbness, weakness, saddle anesthesia or recent spinal manipulation. He has never had spinal surgery, he has been taking ibuprofen with minimal relief. At this time he is pain-free. He states that this pain is worse when he changes position such as getting up from a laying down position or going to a supine position from sitting up.  Patient is a 34 y.o. male presenting with back pain and abdominal pain. The history is provided by the patient.  Back Pain Associated symptoms: abdominal pain   Abdominal Pain   Past Medical History  Diagnosis Date  . Hypertension   . Kidney stone    No past surgical history on file. Family History  Problem Relation Age of Onset  . Heart disease Maternal Grandmother   . Heart disease Maternal Grandfather   . Heart disease Paternal Grandmother   . Heart disease Paternal Grandfather    History  Substance Use Topics  . Smoking status: Former Smoker -- 0.50 packs/day    Types: Cigarettes  . Smokeless tobacco: Never Used  . Alcohol Use: 0.0 oz/week     Comment: occasional    Review of Systems  Gastrointestinal: Positive for abdominal pain.   Musculoskeletal: Positive for back pain.  All other systems reviewed and are negative.     Allergies  Review of patient's allergies indicates no known allergies.  Home Medications   Prior to Admission medications   Medication Sig Start Date End Date Taking? Authorizing Provider  amLODipine (NORVASC) 10 MG tablet TAKE 1 TABLET BY MOUTH EVERY DAY 07/15/14   Salley Scarlet, MD  cetirizine (ZYRTEC) 10 MG tablet Take 1 tablet (10 mg total) by mouth daily. 07/09/14   Salley Scarlet, MD  cyclobenzaprine (FLEXERIL) 10 MG tablet Take 1 tablet (10 mg total) by mouth 2 (two) times daily as needed for muscle spasms. 09/25/14   Eber Hong, MD  fluticasone (FLONASE) 50 MCG/ACT nasal spray Place 2 sprays into both nostrils daily. 01/08/14   Salley Scarlet, MD  lisinopril (PRINIVIL,ZESTRIL) 20 MG tablet TAKE 1 TABLET BY MOUTH EVERY DAY 07/15/14   Salley Scarlet, MD  naproxen (NAPROSYN) 500 MG tablet Take 1 tablet (500 mg total) by mouth 2 (two) times daily with a meal. 09/25/14   Eber Hong, MD   BP 114/66 mmHg  Pulse 91  Temp(Src) 98.6 F (37 C) (Oral)  Resp 18  Ht  (1.803 m)  Wt 260 lb (117.935 kg)  BMI 36.28 kg/m2  SpO2 99% Physical Exam  Constitutional: He appears well-developed and well-nourished. No distress.  HENT:  Head: Normocephalic and atraumatic.  Mouth/Throat: Oropharynx is clear and moist. No oropharyngeal exudate.  Eyes: Conjunctivae and  EOM are normal. Pupils are equal, round, and reactive to light. Right eye exhibits no discharge. Left eye exhibits no discharge. No scleral icterus.  Neck: Normal range of motion. Neck supple. No JVD present. No thyromegaly present.  Cardiovascular: Normal rate, regular rhythm, normal heart sounds and intact distal pulses.  Exam reveals no gallop and no friction rub.   No murmur heard. Pulmonary/Chest: Effort normal and breath sounds normal. No respiratory distress. He has no wheezes. He has no rales.  Abdominal: Soft. Bowel sounds  are normal. He exhibits no distension and no mass. There is no tenderness.  Nontender abdomen  Musculoskeletal: Normal range of motion. He exhibits no edema or tenderness.  There is no tenderness to palpation over the spine or the paraspinal muscles of the back, no CVA tenderness  Lymphadenopathy:    He has no cervical adenopathy.  Neurological: He is alert. Coordination normal.  , Normal gait, normal strength and sensation of the bilateral lower extremities  Skin: Skin is warm and dry. No rash noted. No erythema.  Psychiatric: He has a normal mood and affect. His behavior is normal.  Nursing note and vitals reviewed.   ED Course  Procedures (including critical care time) Labs Review Labs Reviewed  CBC WITH DIFFERENTIAL/PLATELET - Abnormal; Notable for the following:    WBC 10.9 (*)    All other components within normal limits  URINALYSIS, ROUTINE W REFLEX MICROSCOPIC ([facility]) - Abnormal; Notable for the following:    Hgb urine dipstick MODERATE (*)    All other components within normal limits  COMPREHENSIVE METABOLIC PANEL  URINE MICROSCOPIC-ADD ON    Imaging Review No results found.    MDM   Final diagnoses:  Low back pain without sciatica, unspecified back pain laterality    The patient has a benign appearance, vital signs are normal, doubt kidney stone given the movement induced pain, check urinalysis for blood or infection, anticipate treating with anti-inflammatory's and muscle relaxants, the patient was given the instructions, he is in agreement with the plan.  UA negative, VS normal - stable for d/c.  liktly MSK type pain.  Unlikely to be pathological.  Meds given in ED:  Medications - No data to display  New Prescriptions   CYCLOBENZAPRINE (FLEXERIL) 10 MG TABLET    Take 1 tablet (10 mg total) by mouth 2 (two) times daily as needed for muscle spasms.   NAPROXEN (NAPROSYN) 500 MG TABLET    Take 1 tablet (500 mg total) by mouth 2 (two) times daily with a  meal.      Eber HongBrian Shakeerah Gradel, MD 09/25/14 2127

## 2014-09-25 NOTE — ED Notes (Signed)
Pt states that he has had lower back pain that wraps to abdomen. Pt states that this feels similar to kidneys stones that he had several years ago. Pt denies any urinary symptoms.

## 2014-09-25 NOTE — Discharge Instructions (Signed)

## 2014-09-25 NOTE — ED Notes (Signed)
Signature pad in room not functioning.  Pt verbalized understanding of all discharge orders, including prescriptions and home care.  All questions answered.

## 2014-09-26 ENCOUNTER — Emergency Department (HOSPITAL_COMMUNITY): Payer: BLUE CROSS/BLUE SHIELD

## 2014-09-26 ENCOUNTER — Encounter (HOSPITAL_COMMUNITY): Payer: Self-pay | Admitting: Emergency Medicine

## 2014-09-26 ENCOUNTER — Emergency Department (HOSPITAL_COMMUNITY)
Admission: EM | Admit: 2014-09-26 | Discharge: 2014-09-26 | Disposition: A | Discharge status: Home / Self Care | Payer: BLUE CROSS/BLUE SHIELD | Attending: Emergency Medicine | Admitting: Emergency Medicine

## 2014-09-26 DIAGNOSIS — M549 Dorsalgia, unspecified: Secondary | ICD-10-CM

## 2014-09-26 DIAGNOSIS — Z87442 Personal history of urinary calculi: Secondary | ICD-10-CM | POA: Diagnosis not present

## 2014-09-26 DIAGNOSIS — I10 Essential (primary) hypertension: Secondary | ICD-10-CM | POA: Diagnosis not present

## 2014-09-26 DIAGNOSIS — M545 Low back pain: Secondary | ICD-10-CM | POA: Insufficient documentation

## 2014-09-26 DIAGNOSIS — Z79899 Other long term (current) drug therapy: Secondary | ICD-10-CM | POA: Insufficient documentation

## 2014-09-26 DIAGNOSIS — Z7951 Long term (current) use of inhaled steroids: Secondary | ICD-10-CM | POA: Insufficient documentation

## 2014-09-26 DIAGNOSIS — Z87891 Personal history of nicotine dependence: Secondary | ICD-10-CM | POA: Diagnosis not present

## 2014-09-26 MED ORDER — HYDROCODONE-ACETAMINOPHEN 5-325 MG PO TABS: 2.0000 | ORAL_TABLET | ORAL | Status: DC | PRN

## 2014-09-26 MED ORDER — PREDNISONE 10 MG PO TABS: ORAL_TABLET | ORAL | Status: DC

## 2014-09-26 NOTE — ED Notes (Signed)
Pt was seen yesterday for LBP and prescribed Naprosyn and Flexeril. Pt did not take these medicines. Returns today for worsening back pain.

## 2014-09-26 NOTE — ED Notes (Signed)
Pt asked to get undressed.

## 2014-09-26 NOTE — ED Notes (Signed)
Declined W/C at D/C and was escorted to lobby by RN. 

## 2014-09-26 NOTE — ED Provider Notes (Signed)
CSN: [id number]     Arrival date & time 09/26/14  1027 History  This chart was scribed for non-physician practitioner, Lonia Skinner. Keenan Bachelor, PA-C working with Jerelyn Scott, MD by Doreatha Martin, ED scribe. This patient was seen in room TR06C/TR06C and the patient's care was started at 11:49 AM    Chief Complaint  Patient presents with  . Back Pain   The history is provided by the patient. No language interpreter was used.    HPI Comments: John Cisneros is a 34 y.o. male with Hx of HTN and renal calculi who presents to the Emergency Department complaining of gradually worsening, moderate left-sided lower back pain onset 3 days ago and worsened today. He reports that pain is exacerbated by movement and is mildly relieved by sitting still. He states that his current Sx do not feel very similar to previous episode of renal calculi. Pt was seen in the ED on 09/25/14 for similar symptoms and was prescribed Flexeril and Naprosyn. Pt denies any previous injury.   Past Medical History  Diagnosis Date  . Hypertension   . Kidney stone    History reviewed. No pertinent past surgical history. Family History  Problem Relation Age of Onset  . Heart disease Maternal Grandmother   . Heart disease Maternal Grandfather   . Heart disease Paternal Grandmother   . Heart disease Paternal Grandfather    History  Substance Use Topics  . Smoking status: Former Smoker -- 0.50 packs/day    Types: Cigarettes  . Smokeless tobacco: Never Used  . Alcohol Use: 0.0 oz/week     Comment: occasional    Review of Systems  Musculoskeletal: Positive for back pain.  All other systems reviewed and are negative.   Allergies  Review of patient's allergies indicates no known allergies.  Home Medications   Prior to Admission medications   Medication Sig Start Date End Date Taking? Authorizing Provider  amLODipine (NORVASC) 10 MG tablet TAKE 1 TABLET BY MOUTH EVERY DAY 07/15/14   Salley Scarlet, MD  cetirizine (ZYRTEC) 10  MG tablet Take 1 tablet (10 mg total) by mouth daily. 07/09/14   Salley Scarlet, MD  cyclobenzaprine (FLEXERIL) 10 MG tablet Take 1 tablet (10 mg total) by mouth 2 (two) times daily as needed for muscle spasms. 09/25/14   Eber Hong, MD  fluticasone (FLONASE) 50 MCG/ACT nasal spray Place 2 sprays into both nostrils daily. 01/08/14   Salley Scarlet, MD  lisinopril (PRINIVIL,ZESTRIL) 20 MG tablet TAKE 1 TABLET BY MOUTH EVERY DAY 07/15/14   Salley Scarlet, MD  naproxen (NAPROSYN) 500 MG tablet Take 1 tablet (500 mg total) by mouth 2 (two) times daily with a meal. 09/25/14   Eber Hong, MD   Triage VS: BP 130/78 mmHg  Pulse 72  Temp(Src) 98.6 F (37 C)  Resp 18  SpO2 93% Physical Exam  Constitutional: He is oriented to person, place, and time. He appears well-developed and well-nourished. No distress.  HENT:  Head: Normocephalic and atraumatic.  Mouth/Throat: Oropharynx is clear and moist.  Eyes: Conjunctivae and EOM are normal. Pupils are equal, round, and reactive to light.  Neck: Normal range of motion. Neck supple. No tracheal deviation present.  Cardiovascular: Normal rate and normal heart sounds.  Exam reveals no gallop and no friction rub.   No murmur heard. Pulmonary/Chest: Breath sounds normal. No respiratory distress.  Abdominal: Soft. He exhibits no distension. There is no tenderness. There is no rebound and no guarding.  Musculoskeletal: Normal  range of motion. He exhibits no edema or tenderness.  Left CVA tenderness  Neurological: He is alert and oriented to person, place, and time.  Skin: Skin is warm and dry.  Psychiatric: He has a normal mood and affect. His behavior is normal.  Nursing note and vitals reviewed.   ED Course  Procedures (including critical care time) DIAGNOSTIC STUDIES: Oxygen Saturation is 93% on RA, adequate by my interpretation.    COORDINATION OF CARE: 11:52 AM Discussed treatment plan with pt at bedside which includes CT A/P wo contrast. Pt  agreed to plan.   Labs Review Labs Reviewed - No data to display  Imaging Review Ct Renal Stone Study  09/26/2014   CLINICAL DATA:  Left-sided low back and flank pain  EXAM: CT ABDOMEN AND PELVIS WITHOUT CONTRAST  TECHNIQUE: Multidetector CT imaging of the abdomen and pelvis was performed following the standard protocol without oral or intravenous contrast material administration.  COMPARISON:  May 25, 2011  FINDINGS: Lung bases are clear.  No focal liver lesions are identified on this noncontrast enhanced study. Gallbladder wall is not appreciably thickened. There is no biliary duct dilatation.  There is a 1 cm apparent cyst in the posterior inferior spleen, stable. No new splenic lesions are identified. Pancreas and adrenals appear normal.  Kidneys bilaterally show no mass or hydronephrosis on either side. There is no renal or ureteral calculus on either side.  In the pelvis, the urinary bladder is midline with normal wall thickness. There is no pelvic mass or pelvic fluid collection. Appendix appears normal.  There is no bowel obstruction. No free air or portal venous air. There is no demonstrable ascites, adenopathy, or abscess in the abdomen or pelvis. There is no abdominal aortic aneurysm. There are no blastic or lytic bone lesions.  IMPRESSION: No renal or ureteral calculus. No hydronephrosis. No mesenteric inflammation or bowel obstruction. No abscess. Appendix appears normal.  A cause for the patient's symptoms has not been established with this study.   Electronically Signed   By: Bretta BangWilliam  Woodruff III M.D.   On: 09/26/2014 13:10     EKG Interpretation None     MDM   Final diagnoses:  Back pain   rx for hydrocodone rx for prednisone taper Follow up with your primary MD for recheck.   Lonia SkinnerLeslie K MantiSofia, PA-C 09/26/14 1358  Jerelyn ScottMartha Linker, MD 09/26/14 [id number]

## 2014-10-09 ENCOUNTER — Encounter: Payer: BLUE CROSS/BLUE SHIELD | Admitting: Family Medicine

## 2014-10-13 ENCOUNTER — Other Ambulatory Visit: Payer: Self-pay | Admitting: Family Medicine

## 2014-10-14 ENCOUNTER — Other Ambulatory Visit: Payer: Self-pay | Admitting: Family Medicine

## 2014-10-14 ENCOUNTER — Encounter: Payer: Self-pay | Admitting: Family Medicine

## 2014-10-14 NOTE — Telephone Encounter (Signed)
Medication refill for one time only.  Patient needs to be seen.  Letter sent for patient to call and schedule 

## 2014-11-05 ENCOUNTER — Ambulatory Visit: Payer: Self-pay

## 2014-11-05 ENCOUNTER — Other Ambulatory Visit: Payer: Self-pay | Admitting: Occupational Medicine

## 2014-11-05 DIAGNOSIS — M542 Cervicalgia: Secondary | ICD-10-CM

## 2014-11-13 ENCOUNTER — Encounter: Payer: BLUE CROSS/BLUE SHIELD | Admitting: Family Medicine

## 2014-11-13 ENCOUNTER — Other Ambulatory Visit: Payer: Self-pay | Admitting: Family Medicine

## 2014-11-13 NOTE — Telephone Encounter (Signed)
Medication refilled per protocol. 

## 2014-11-27 ENCOUNTER — Encounter: Payer: BLUE CROSS/BLUE SHIELD | Admitting: Family Medicine

## 2014-12-12 ENCOUNTER — Other Ambulatory Visit: Payer: Self-pay | Admitting: Family Medicine

## 2014-12-12 NOTE — Telephone Encounter (Signed)
Medication filled x1 with no refills.   Requires office visit before any further refills can be given.   Letter sent.  

## 2015-01-07 ENCOUNTER — Encounter: Payer: BLUE CROSS/BLUE SHIELD | Admitting: Family Medicine

## 2015-01-13 ENCOUNTER — Other Ambulatory Visit: Payer: Self-pay | Admitting: Family Medicine

## 2015-01-13 NOTE — Telephone Encounter (Signed)
Refill appropriate and filled per protocol. 

## 2015-01-28 ENCOUNTER — Encounter: Payer: Self-pay | Admitting: Family Medicine

## 2015-01-28 ENCOUNTER — Ambulatory Visit (INDEPENDENT_AMBULATORY_CARE_PROVIDER_SITE_OTHER): Payer: BLUE CROSS/BLUE SHIELD | Admitting: Family Medicine

## 2015-01-28 VITALS — BP 149/98 | HR 88 | Temp 98.6°F | Resp 18 | Ht 71.0 in | Wt 283.0 lb

## 2015-01-28 DIAGNOSIS — R809 Proteinuria, unspecified: Secondary | ICD-10-CM

## 2015-01-28 DIAGNOSIS — E669 Obesity, unspecified: Secondary | ICD-10-CM | POA: Diagnosis not present

## 2015-01-28 DIAGNOSIS — R5383 Other fatigue: Secondary | ICD-10-CM

## 2015-01-28 DIAGNOSIS — Z23 Encounter for immunization: Secondary | ICD-10-CM

## 2015-01-28 DIAGNOSIS — Z0000 Encounter for general adult medical examination without abnormal findings: Secondary | ICD-10-CM

## 2015-01-28 DIAGNOSIS — I10 Essential (primary) hypertension: Secondary | ICD-10-CM | POA: Diagnosis not present

## 2015-01-28 LAB — COMPREHENSIVE METABOLIC PANEL
ALK PHOS: 59 U/L (ref 40–115)
ALT: 27 U/L (ref 9–46)
AST: 21 U/L (ref 10–40)
Albumin: 4.4 g/dL (ref 3.6–5.1)
BUN: 15 mg/dL (ref 7–25)
CALCIUM: 9.6 mg/dL (ref 8.6–10.3)
CHLORIDE: 102 mmol/L (ref 98–110)
CO2: 25 mmol/L (ref 20–31)
Creat: 1.1 mg/dL (ref 0.60–1.35)
GLUCOSE: 91 mg/dL (ref 70–99)
POTASSIUM: 4.3 mmol/L (ref 3.5–5.3)
Sodium: 136 mmol/L (ref 135–146)
Total Bilirubin: 0.6 mg/dL (ref 0.2–1.2)
Total Protein: 7.3 g/dL (ref 6.1–8.1)

## 2015-01-28 LAB — LIPID PANEL
CHOL/HDL RATIO: 3.4 ratio (ref <=5.0)
CHOLESTEROL: 109 mg/dL — AB (ref 125–200)
HDL: 32 mg/dL — AB (ref >=40)
LDL Cholesterol: 60 mg/dL (ref <130)
Triglycerides: 86 mg/dL (ref <150)
VLDL: 17 mg/dL (ref <30)

## 2015-01-28 LAB — CBC WITH DIFFERENTIAL/PLATELET
BASOS ABS: 0 10*3/uL (ref 0.0–0.1)
Basophils Relative: 0 % (ref 0–1)
EOS ABS: 0.1 10*3/uL (ref 0.0–0.7)
EOS PCT: 1 % (ref 0–5)
HEMATOCRIT: 48.2 % (ref 39.0–52.0)
Hemoglobin: 16.6 g/dL (ref 13.0–17.0)
LYMPHS ABS: 1.8 10*3/uL (ref 0.7–4.0)
LYMPHS PCT: 21 % (ref 12–46)
MCH: 31.3 pg (ref 26.0–34.0)
MCHC: 34.4 g/dL (ref 30.0–36.0)
MCV: 90.8 fL (ref 78.0–100.0)
MONO ABS: 0.7 10*3/uL (ref 0.1–1.0)
MONOS PCT: 8 % (ref 3–12)
MPV: 9.9 fL (ref 8.6–12.4)
Neutro Abs: 6 10*3/uL (ref 1.7–7.7)
Neutrophils Relative %: 70 % (ref 43–77)
PLATELETS: 287 10*3/uL (ref 150–400)
RBC: 5.31 MIL/uL (ref 4.22–5.81)
RDW: 12.5 % (ref 11.5–15.5)
WBC: 8.6 10*3/uL (ref 4.0–10.5)

## 2015-01-28 LAB — URINALYSIS, MICROSCOPIC ONLY
CASTS: NONE SEEN [LPF]
CRYSTALS: NONE SEEN [HPF]
Yeast: NONE SEEN [HPF]

## 2015-01-28 LAB — URINALYSIS, ROUTINE W REFLEX MICROSCOPIC
BILIRUBIN URINE: NEGATIVE
Glucose, UA: NEGATIVE
KETONES UR: NEGATIVE
Leukocytes, UA: NEGATIVE
Nitrite: NEGATIVE
PH: 7 (ref 5.0–8.0)
SPECIFIC GRAVITY, URINE: 1.02 (ref 1.001–1.035)

## 2015-01-28 NOTE — Progress Notes (Signed)
Pt also had hearburn over past few weeks, noticed increased phelgm after eating. No N/V, no chagne in bowels Given Nexium samples to try

## 2015-01-28 NOTE — Patient Instructions (Addendum)
Tetanus Booster given Take B12 once a day  Multivitamin 1 a day  Continue blood pressure medication Nexium for the reflux once a day  Give work note for today  F/U 6 Months

## 2015-01-28 NOTE — Assessment & Plan Note (Signed)
He has known proteinuria as well as microscopic hematuria which has been violated by urology in the past. He needs a repeat urinary studies

## 2015-01-28 NOTE — Assessment & Plan Note (Signed)
No blood pressure medications taken today he will restart his regular medications blood pressure has been well controlled renal rate importance of diet and weight loss to decrease his risk of diabetes and further heart disease

## 2015-01-28 NOTE — Assessment & Plan Note (Signed)
I think his fatigue is due to his long work hours as well as his poor diet and his weight gain. Regarding the tingling that he felt in his toes are not seeing anything abnormal on examination. He can take B-12 and a multivitamin I'm also checking his fasting glucose at this time I did not see any other red flags.

## 2015-01-28 NOTE — Progress Notes (Signed)
Patient ID: John Cisneros, male   DOB: 1980-08-25, 34 y.o.   MRN: [id number]   Subjective:    Patient ID: John Cisneros, male    DOB: Apr 01, 1981, 34 y.o.   MRN: [id number]  Patient presents for Annual Exam  patient here for a new exam. Since her last visit he is now married. He states that things are going well with him and his wife. He is working a lot of hours often 6 or 7 days in a week. He has been fatigued the past couple months. The past week he has felt some tingling on and off in his fingertips and in his toes he denies any recent back pain were injury. He denies any polyuria polydipsia he does have some family history of diabetes.  He is taking his medications as prescribed but he did not take his medicines today since he was fasting.    Review Of Systems:  GEN- + fatigue, fever, weight loss,weakness, recent illness HEENT- denies eye drainage, change in vision, nasal discharge, CVS- denies chest pain, palpitations RESP- denies SOB, cough, wheeze ABD- denies N/V, change in stools, abd pain GU- denies dysuria, hematuria, dribbling, incontinence MSK- denies joint pain, muscle aches, injury Neuro- denies headache, dizziness, syncope, seizure activity       Objective:    BP 149/98 mmHg  Pulse 88  Temp(Src) 98.6 F (37 C) (Oral)  Resp 18  Ht  (1.803 m)  Wt 283 lb (128.368 kg)  BMI 39.49 kg/m2 GEN- NAD, alert and oriented x3 HEENT- PERRL, EOMI, non injected sclera, pink conjunctiva, MMM, oropharynx clear Neck- Supple, no thyromegaly CVS- RRR, no murmur RESP-CTAB ABD-NABS,soft,NT,ND MSK- Spine NT, neg slr, FROM spine, hips,  Neuro- normal tone, motor 5/5 bilat, sensation in tact, normal monofilament EXT- No edema Pulses- Radial, DP- 2+        Assessment & Plan:      Problem List Items Addressed This Visit    Proteinuria    He has known proteinuria as well as microscopic hematuria which has been violated by urology in the past. He needs a repeat  urinary studies      Relevant Orders   Microalbumin / creatinine urine ratio   Urinalysis, Routine w reflex microscopic (not at [facility]) (Completed)   Obesity   Fatigue    I think his fatigue is due to his long work hours as well as his poor diet and his weight gain. Regarding the tingling that he felt in his toes are not seeing anything abnormal on examination. He can take B-12 and a multivitamin I'm also checking his fasting glucose at this time I did not see any other red flags.      Essential hypertension    No blood pressure medications taken today he will restart his regular medications blood pressure has been well controlled renal rate importance of diet and weight loss to decrease his risk of diabetes and further heart disease      Relevant Orders   Lipid panel   Urinalysis, Routine w reflex microscopic (not at [facility]) (Completed)    Other Visit Diagnoses    Routine general medical examination at a health care facility    -  Primary    CPE done, TDAP given, declines Flu shot, STD screening not needed    Relevant Orders    CBC with Differential/Platelet    Comprehensive metabolic panel    Lipid panel    Need for Tdap vaccination  Relevant Orders    Tdap vaccine greater than or equal to 7yo IM (Completed)       Note: This dictation was prepared with Dragon dictation along with smaller phrase technology. Any transcriptional errors that result from this process are unintentional.

## 2015-01-29 LAB — MICROALBUMIN / CREATININE URINE RATIO
CREATININE, URINE: 204 mg/dL
MICROALB UR: 5.7 mg/dL — AB (ref <2.0)
MICROALB/CREAT RATIO: 27.9 mg/g (ref 0.0–30.0)

## 2015-02-12 ENCOUNTER — Telehealth: Payer: Self-pay | Admitting: Family Medicine

## 2015-02-12 ENCOUNTER — Encounter: Payer: Self-pay | Admitting: Family Medicine

## 2015-02-12 ENCOUNTER — Ambulatory Visit (INDEPENDENT_AMBULATORY_CARE_PROVIDER_SITE_OTHER): Payer: BLUE CROSS/BLUE SHIELD | Admitting: Family Medicine

## 2015-02-12 VITALS — BP 136/76 | HR 82 | Temp 98.7°F | Resp 14 | Ht 71.0 in | Wt 286.0 lb

## 2015-02-12 DIAGNOSIS — M7122 Synovial cyst of popliteal space [Baker], left knee: Secondary | ICD-10-CM

## 2015-02-12 MED ORDER — AMLODIPINE BESYLATE 10 MG PO TABS: 10.0000 mg | ORAL_TABLET | Freq: Every day | ORAL | Status: DC

## 2015-02-12 MED ORDER — IBUPROFEN 800 MG PO TABS: 800.0000 mg | ORAL_TABLET | Freq: Three times a day (TID) | ORAL | Status: DC | PRN

## 2015-02-12 MED ORDER — LISINOPRIL 20 MG PO TABS: 20.0000 mg | ORAL_TABLET | Freq: Every day | ORAL | Status: DC

## 2015-02-12 NOTE — Telephone Encounter (Signed)
Patient returned call.   States that he has swelling behind knee. Denies pain or tenderness to touch. Denies injury to area.   Appointment scheduled.

## 2015-02-12 NOTE — Telephone Encounter (Signed)
Call placed to patient. LMTRC.  

## 2015-02-12 NOTE — Progress Notes (Signed)
Patient ID: John Cisneros, male   DOB: 1981-04-26, 34 y.o.   MRN: [id number]   Subjective:    Patient ID: John MessierCortney T Cisneros, male    DOB: 1981-04-26, 34 y.o.   MRN: [id number]  Patient presents for L Knee Edema  patient here with swelling behind his left knee. He noticed some swelling in the popliteal area about 2-3 days ago. He initially has some tenderness but that is resolved. Now the swelling is going down and he is able to walk on it without difficulties. He took some ibuprofen yesterday and today which also helped. He does not remember any particular injury denies any calf tenderness , he has not had any vacations or long flights.  The Nexium did not help but he still notices that he has to clear his throat after he eats. He denies any difficulty swallowing. He does notice it more when he eats spicy foods.  Review Of Systems:  GEN- denies fatigue, fever, weight loss,weakness, recent illness HEENT- denies eye drainage, change in vision, nasal discharge, CVS- denies chest pain, palpitations RESP- denies SOB, cough, wheeze MSK- +joint pain, muscle aches, injury Neuro- denies headache, dizziness, syncope, seizure activity       Objective:    BP 136/76 mmHg  Pulse 82  Temp(Src) 98.7 F (37.1 C) (Oral)  Resp 14  Ht 5\' 11"  (1.803 m)  Wt 286 lb (129.729 kg)  BMI 39.91 kg/m2 GEN- NAD, alert and oriented x3 MSK- bilat knee normal inspection, left popliteal mild swelling ,NT, Calf NT, neg homans bilat, normal gait Skin- in tact, no ecchymosis  Ext- no pedal edema        Assessment & Plan:      Problem List Items Addressed This Visit    None    Visit Diagnoses    Popliteal cyst, left    -  Primary    Based on exam I think he may have had popliteal cyst, no signigicant swelling or pain today. Will hold on imaging, NSAIDS, ICE. if this worsens US to be done.   With regards to the throat issue, will try zantac,avoidance of foods causing issues       Note: This dictation was  prepared with Dragon dictation along with smaller phrase technology. Any transcriptional errors that result from this process are unintentional.

## 2015-02-12 NOTE — Telephone Encounter (Signed)
[phone] Patient is calling to say he is having swelling behind his knee and would like to speak to you regarding this

## 2015-02-12 NOTE — Patient Instructions (Signed)
Use Ice , take ibuprofen twice a day for the 2-3 days  Call for any changes  Try zantac over the counter   Baker Cyst A Baker cyst is a sac-like structure that forms in the back of the knee. It is filled with the same fluid that is located in your knee. This fluid lubricates the bones and cartilage of the knee and allows them to move over each other more easily. CAUSES  When the knee becomes injured or inflamed, increased fluid forms in the knee. When this happens, the joint lining is pushed out behind the knee and forms the Baker cyst. This cyst may also be caused by inflammation from arthritic conditions and infections. SIGNS AND SYMPTOMS  A Baker cyst usually has no symptoms. When the cyst is substantially enlarged:  You may feel pressure behind the knee, stiffness in the knee, or a mass in the area behind the knee.  You may develop pain, redness, and swelling in the calf. This can suggest a blood clot and requires evaluation by your health care provider. DIAGNOSIS  A Baker cyst is most often found during an ultrasound exam. This exam may have been performed for other reasons, and the cyst was found incidentally. Sometimes an MRI is used. This picks up other problems within a joint that an ultrasound exam may not. If the Baker cyst developed immediately after an injury, X-ray exams may be used to diagnose the cyst. TREATMENT  The treatment depends on the cause of the cyst. Anti-inflammatory medicines and rest often will be prescribed. If the cyst is caused by a bacterial infection, antibiotic medicines may be prescribed.  HOME CARE INSTRUCTIONS   If the cyst was caused by an injury, for the first 24 hours, keep the injured leg elevated on 2 pillows while lying down.  For the first 24 hours while you are awake, apply ice to the injured area:  Put ice in a plastic bag.  Place a towel between your skin and the bag.  Leave the ice on for 20 minutes, 2-3 times a day.  Only take  over-the-counter or prescription medicines for pain, discomfort, or fever as directed by your health care provider.  Only take antibiotic medicine as directed. Make sure to finish it even if you start to feel better. MAKE SURE YOU:   Understand these instructions.  Will watch your condition.  Will get help right away if you are not doing well or get worse.   This information is not intended to replace advice given to you by your health care provider. Make sure you discuss any questions you have with your health care provider.   Document Released: 04/12/2005 Document Revised: 01/31/2013 Document Reviewed: 11/22/2012 Elsevier Interactive Patient Education [employer]2016 [employer] Inc.

## 2015-02-17 ENCOUNTER — Telehealth: Payer: Self-pay | Admitting: Family Medicine

## 2015-02-17 NOTE — Telephone Encounter (Signed)
Pt is calling to let Dr. Jeanice Limurham know that he is not having pain and swelling in the back of his right knee. Please advise [phone]

## 2015-02-17 NOTE — Telephone Encounter (Signed)
Call placed to patient to inquire. LMTRC. 

## 2015-02-18 NOTE — Telephone Encounter (Signed)
noted 

## 2015-02-18 NOTE — Telephone Encounter (Signed)
Call placed to patient.   Reports that L Knee has improved with ICE and NSADIS. States that he has new area on R knee resembling L knee. States that he will use same treatment, but if area does not improve he will contact office on Thursday.   MD to be made aware.

## 2015-03-17 ENCOUNTER — Encounter (HOSPITAL_COMMUNITY): Payer: Self-pay

## 2015-03-17 ENCOUNTER — Emergency Department (HOSPITAL_COMMUNITY)
Admission: EM | Admit: 2015-03-17 | Discharge: 2015-03-18 | Disposition: A | Discharge status: Home / Self Care | Payer: BLUE CROSS/BLUE SHIELD | Attending: Emergency Medicine | Admitting: Emergency Medicine

## 2015-03-17 DIAGNOSIS — Z7951 Long term (current) use of inhaled steroids: Secondary | ICD-10-CM | POA: Diagnosis not present

## 2015-03-17 DIAGNOSIS — M7122 Synovial cyst of popliteal space [Baker], left knee: Secondary | ICD-10-CM | POA: Diagnosis not present

## 2015-03-17 DIAGNOSIS — I10 Essential (primary) hypertension: Secondary | ICD-10-CM | POA: Insufficient documentation

## 2015-03-17 DIAGNOSIS — R2242 Localized swelling, mass and lump, left lower limb: Secondary | ICD-10-CM | POA: Diagnosis present

## 2015-03-17 DIAGNOSIS — Z79899 Other long term (current) drug therapy: Secondary | ICD-10-CM | POA: Insufficient documentation

## 2015-03-17 DIAGNOSIS — Z87442 Personal history of urinary calculi: Secondary | ICD-10-CM | POA: Insufficient documentation

## 2015-03-17 DIAGNOSIS — M7121 Synovial cyst of popliteal space [Baker], right knee: Secondary | ICD-10-CM | POA: Insufficient documentation

## 2015-03-17 DIAGNOSIS — Z87891 Personal history of nicotine dependence: Secondary | ICD-10-CM | POA: Insufficient documentation

## 2015-03-17 NOTE — ED Provider Notes (Signed)
CSN: [id number]     Arrival date & time 03/17/15  2022 History  By signing my name below, I, John Cisneros, attest that this documentation has been prepared under the direction and in the presence of John AlbeIva Cloteal Isaacson, MD at 2343. Electronically Signed: [name], ED Scribe. 03/17/2015. 12:33 AM.     Chief Complaint  Patient presents with  . Leg Swelling   The history is provided by the patient. No language interpreter was used.   HPI Comments: Ivor MessierCortney T Cisneros is a 34 y.o. male with a hx of HTN who presents to the Emergency Department complaining of gradually worsening intermittent tingling and intermittent roving (mainly the shin areas) sharp pain in bilateral legs onset one week ago. John Cisneros denies any acute leg swelling, pain in his calf, trouble ambulating, CP, SOB, or any fever. John Cisneros is not limping when John Cisneros walks. John Cisneros is currently on lisinopril and 2 ibuprofen daily. John Cisneros denies using any ice or heat for relief. John Cisneros reports that when John Cisneros saw his PCP several weeks ago with swelling behind his knees, John Cisneros was diagnosed with Baker's cyst, worse behind his left knee. John Cisneros denies going to see an Orthopedic.  PCP: Dr. Jeanice Limurham  Past Medical History  Diagnosis Date  . Hypertension   . Kidney stone    History reviewed. No pertinent past surgical history. Family History  Problem Relation Age of Onset  . Heart disease Maternal Grandmother   . Heart disease Maternal Grandfather   . Heart disease Paternal Grandmother   . Heart disease Paternal Grandfather    Social History  Substance Use Topics  . Smoking status: Former Smoker -- 0.50 packs/day    Types: Cigarettes  . Smokeless tobacco: Never Used  . Alcohol Use: 0.0 oz/week     Comment: occasional  John Cisneros reports that John Cisneros is an occasional cigar smoker and denies any alcohol use.  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    Review of Systems  Constitutional: Negative for fever and chills.  Respiratory: Negative  for shortness of breath.   Cardiovascular: Negative for chest pain.  Musculoskeletal: Positive for arthralgias. Negative for joint swelling (no acute leg swelling) and gait problem.  Neurological:       Tingling  All other systems reviewed and are negative.     Allergies  Review of patient's allergies indicates no known allergies.  Home Medications   Prior to Admission medications   Medication Sig Start Date End Date Taking? Authorizing Provider  amLODipine (NORVASC) 10 MG tablet Take 1 tablet (10 mg total) by mouth daily. 02/12/15   John Cisneros [place], MD  diclofenac sodium (VOLTAREN) 1 % GEL Apply 2 g topically 4 (four) times daily. 03/18/15   John AlbeIva Ruby Logiudice, MD  fluticasone (FLONASE) 50 MCG/ACT nasal spray Place 2 sprays into both nostrils daily. 01/08/14   John Cisneros [place], MD  ibuprofen (ADVIL,MOTRIN) 800 MG tablet Take 1 tablet (800 mg total) by mouth every 8 (eight) hours as needed. 02/12/15   John Cisneros [place], MD  lisinopril (PRINIVIL,ZESTRIL) 20 MG tablet Take 1 tablet (20 mg total) by mouth daily. 02/12/15   John Cisneros [place]mond[place], MD   BP 137/100 mmHg  Pulse 91  Temp(Src) 98 Cisneros (36.7 C) (Oral)  Resp 16  Ht 5\' 11"  (1.803 m)  Wt 265 lb (120.203 kg)  BMI 36.98 kg/m2  SpO2 96%  Vital signs normal   Physical Exam  Constitutional: John Cisneros is oriented to person, place, and  time. John Cisneros appears well-developed and well-nourished.  Non-toxic appearance. John Cisneros does not appear ill. No distress.  HENT:  Head: Normocephalic and atraumatic.  Right Ear: External ear normal.  Left Ear: External ear normal.  Nose: Nose normal. No mucosal edema or rhinorrhea.  Mouth/Throat: Oropharynx is clear and moist and mucous membranes are normal. No dental abscesses or uvula swelling.  Eyes: Conjunctivae and EOM are normal. Pupils are equal, round, and reactive to light.  Neck: Normal range of motion and full passive range of motion without pain. Neck supple.  Cardiovascular: Normal rate, regular rhythm and normal  heart sounds.  Exam reveals no gallop and no friction rub.   No murmur heard. Pulmonary/Chest: Effort normal and breath sounds normal. No respiratory distress. John Cisneros has no wheezes. John Cisneros has no rhonchi. John Cisneros has no rales. John Cisneros exhibits no tenderness and no crepitus.  Abdominal: Soft. Normal appearance and bowel sounds are normal. John Cisneros exhibits no distension. There is no tenderness. There is no rebound and no guarding.  Musculoskeletal: Normal range of motion. John Cisneros exhibits no edema or tenderness.  Moves all extremities well.  Ankles, calves, and knees are non TTP, no edema Fullness in the popliteal area, more on the left than right.  John Cisneros has good ROM without pain.   Neurological: John Cisneros is alert and oriented to person, place, and time. John Cisneros has normal strength. No cranial nerve deficit.  Skin: Skin is warm, dry and intact. No rash noted. No erythema. No pallor.  Psychiatric: John Cisneros has a normal mood and affect. His speech is normal and behavior is normal. His mood appears not anxious.  Nursing note and vitals reviewed.   ED Course  Procedures (including critical care time)    DIAGNOSTIC STUDIES: Oxygen Saturation is 96% on RA, adequate by my interpretation.    COORDINATION OF CARE: 11:53 PM- Pt advised of plan for treatment and pt agrees. Advised to use ice and heat over area. Will prescribe topical NSAID for area. Follow up with provided Orthopedics for further evaluation and long term care. Patient was concerned John Cisneros had a blood clot. John Cisneros was reassured his pain is not in the appropriate area. We also discussed if the cyst rupture John Cisneros could get acute pain and at that point John Cisneros can be difficult to tell that from a blood clot and a Doppler ultrasound would need to be done.     MDM   Final diagnoses:  Baker's cyst of knee, left  Baker's cyst of knee, right   New Prescriptions   DICLOFENAC SODIUM (VOLTAREN) 1 % GEL    Apply 2 g topically 4 (four) times daily.     I personally performed the services described  in this documentation, which was scribed in my presence. The recorded information has been reviewed and considered.  John Albe, MD, Concha Pyo, MD 03/18/15 [phone]

## 2015-03-17 NOTE — ED Notes (Signed)
Pt dx with Bakers cysts behind his knees two weeks ago and he complains of his legs swelling and slight headaches

## 2015-03-18 MED ORDER — DICLOFENAC SODIUM 1 % TD GEL
2.0000 g | Freq: Four times a day (QID) | TRANSDERMAL | Status: DC
Start: 2015-03-18 — End: 2015-10-29

## 2015-03-18 NOTE — Discharge Instructions (Signed)
Use ice and heat to the swollen areas. Uses the topical voltaren for pain relief so you can stop the motrin. Call Dr Norris's office to get an appointment to recheck the swelling behind your knees. He is an Investment banker, operationalorthopedic surgeon.    Baker Cyst A Baker cyst is a sac-like structure that forms in the back of the knee. It is filled with the same fluid that is located in your knee. This fluid lubricates the bones and cartilage of the knee and allows them to move over each other more easily. CAUSES  When the knee becomes injured or inflamed, increased fluid forms in the knee. When this happens, the joint lining is pushed out behind the knee and forms the Baker cyst. This cyst may also be caused by inflammation from arthritic conditions and infections. SIGNS AND SYMPTOMS  A Baker cyst usually has no symptoms. When the cyst is substantially enlarged:  You may feel pressure behind the knee, stiffness in the knee, or a mass in the area behind the knee.  You may develop pain, redness, and swelling in the calf. This can suggest a blood clot and requires evaluation by your health care provider. DIAGNOSIS  A Baker cyst is most often found during an ultrasound exam. This exam may have been performed for other reasons, and the cyst was found incidentally. Sometimes an MRI is used. This picks up other problems within a joint that an ultrasound exam may not. If the Baker cyst developed immediately after an injury, X-ray exams may be used to diagnose the cyst. TREATMENT  The treatment depends on the cause of the cyst. Anti-inflammatory medicines and rest often will be prescribed. If the cyst is caused by a bacterial infection, antibiotic medicines may be prescribed.  HOME CARE INSTRUCTIONS   If the cyst was caused by an injury, for the first 24 hours, keep the injured leg elevated on 2 pillows while lying down.  For the first 24 hours while you are awake, apply ice to the injured area:  Put ice in a plastic  bag.  Place a towel between your skin and the bag.  Leave the ice on for 20 minutes, 2-3 times a day.  Only take over-the-counter or prescription medicines for pain, discomfort, or fever as directed by your health care provider.  Only take antibiotic medicine as directed. Make sure to finish it even if you start to feel better. MAKE SURE YOU:   Understand these instructions.  Will watch your condition.  Will get help right away if you are not doing well or get worse.   This information is not intended to replace advice given to you by your health care provider. Make sure you discuss any questions you have with your health care provider.   Document Released: 04/12/2005 Document Revised: 01/31/2013 Document Reviewed: 11/22/2012 Elsevier Interactive Patient Education [employer]2016 Elsevier Inc.

## 2015-03-25 ENCOUNTER — Telehealth: Payer: Self-pay | Admitting: Family Medicine

## 2015-03-25 NOTE — Telephone Encounter (Signed)
If more pins and needles this could be from temporary blood flow issue, and should resolve on its own. If he has swelling- make OV Note sure how long this has been going on??

## 2015-03-25 NOTE — Telephone Encounter (Signed)
Call placed to patient.   Reports that tingling has been happen x3 weeks, but more frequently so recently.   States that there is no edema to either feet or legs.

## 2015-03-25 NOTE — Telephone Encounter (Signed)
Call placed to patient.   Reports that he has increased tingling in his feet and legs at night.   Reports that tingling was intermittent, but has become more and more frequent, especially if he has been working out or on his feet a lot throughout the day. States that tingling is in B feet and legs and interrupts his sleep at night.   Denies pain or discoloration to either leg or feet.   MD please advise.

## 2015-03-25 NOTE — Telephone Encounter (Signed)
Call placed to patient and patient made aware.   Appointment scheduled.  

## 2015-03-25 NOTE — Telephone Encounter (Signed)
Schedule OV, he can also start BComplex which helps nerve pain

## 2015-03-25 NOTE — Telephone Encounter (Signed)
Pt states that his feet are tingling and he would like to speak with someone.  [phone]

## 2015-04-02 ENCOUNTER — Ambulatory Visit (INDEPENDENT_AMBULATORY_CARE_PROVIDER_SITE_OTHER): Payer: BLUE CROSS/BLUE SHIELD | Admitting: Family Medicine

## 2015-04-02 ENCOUNTER — Encounter: Payer: Self-pay | Admitting: Family Medicine

## 2015-04-02 VITALS — BP 138/84 | HR 78 | Temp 98.7°F | Resp 14 | Ht 71.0 in | Wt 286.0 lb

## 2015-04-02 DIAGNOSIS — M7120 Synovial cyst of popliteal space [Baker], unspecified knee: Secondary | ICD-10-CM | POA: Diagnosis not present

## 2015-04-02 DIAGNOSIS — R202 Paresthesia of skin: Secondary | ICD-10-CM

## 2015-04-02 MED ORDER — DICLOFENAC SODIUM 75 MG PO TBEC: 75.0000 mg | DELAYED_RELEASE_TABLET | Freq: Two times a day (BID) | ORAL | Status: DC | PRN

## 2015-04-02 MED ORDER — B COMPLEX PO TABS: 1.0000 | ORAL_TABLET | Freq: Every day | ORAL | Status: DC

## 2015-04-02 NOTE — Patient Instructions (Signed)
Take diclofenac with food as needed  US can be done in Jan B complex vitamin sent F/U as previous

## 2015-04-02 NOTE — Progress Notes (Addendum)
Patient ID: John MessierCortney T Cisneros, male   DOB: [birth year], 34 y.o.   MRN: [id number]   Subjective:    Patient ID: John MessierCortney T Cisneros, male    DOB: [birth year], 34 y.o.   MRN: [id number]  Patient presents for Leg Pain    Pt here with intermittant leg pain, mostly behind the knees, he will get swelling and then it will go down, I diagnosed probable bakers cyst last time it improved with NSAIDS but keeps coming back. Seen in ED with same problem same diagnosis given. Past few weeks, now with intermitttant tingling in feet. No cramps in legs, no injury, no back pain, normal blood glucose   Review Of Systems:  GEN- denies fatigue, fever, weight loss,weakness, recent illness HEENT- denies eye drainage, change in vision, nasal discharge, CVS- denies chest pain, palpitations RESP- denies SOB, cough, wheeze ABD- denies N/V, change in stools, abd pain GU- denies dysuria, hematuria, dribbling, incontinence MSK- + joint pain, muscle aches, injury Neuro- denies headache, dizziness, syncope, seizure activity       Objective:    BP 138/84 mmHg  Pulse 78  Temp(Src) 98.7 F (37.1 C) (Oral)  Resp 14  Ht 5\' 11"  (1.803 m)  Wt 286 lb (129.729 kg)  BMI 39.91 kg/m2 GEN- NAD, alert and oriented x3 NEURO-CNII-XII intact, no deficits, monofilament in tact MSK- FROM SPine, HIPS, KNEES, mild swelling in popliteal region, no warmth, NT, FROM Ankles  EXT- No edema Pulses- Radial, DP- 2+        Assessment & Plan:      Problem List Items Addressed This Visit    None    Visit Diagnoses    Paresthesia of foot, bilateral    -  Primary    unclear cause, no vascular compromise, no back pathology, no swelling, treat wtih B Complex, very intermittant symptoms    Baker's cyst of knee, unspecified laterality        Discussed getting imaging, he wants to hold until Jan, diclofenac given       Note: This dictation was prepared with Nurse, children'sDragon dictation along with smaller Lobbyistphrase technology. Any transcriptional errors  that result from this process are unintentional.

## 2015-04-04 ENCOUNTER — Telehealth: Payer: Self-pay | Admitting: *Deleted

## 2015-04-04 MED ORDER — LOSARTAN POTASSIUM 50 MG PO TABS: 50.0000 mg | ORAL_TABLET | Freq: Every day | ORAL | Status: DC

## 2015-04-04 NOTE — Telephone Encounter (Signed)
Call placed to patient. LMTRC.  

## 2015-04-04 NOTE — Telephone Encounter (Signed)
Received call from patient.   Reports that he has concerns about Lisinopril.   States that he has known (2) peers to have an allergic reaction to lisinopril, one being fatal with anaphylaxis. States that he has read FDA warnings about drug and would like medication changed to something different.   [phone].

## 2015-04-04 NOTE — Telephone Encounter (Signed)
He has not had any difficulty, some people may have allergic reaction but this would have already occurred in him. If he just declines taking start Losartan 50mg 

## 2015-04-04 NOTE — Telephone Encounter (Signed)
Call placed to patient and patient made aware.   States that he would prefer to change to losartan.   Prescription sent to pharmacy.

## 2015-04-07 ENCOUNTER — Telehealth: Payer: Self-pay | Admitting: Family Medicine

## 2015-04-07 NOTE — Telephone Encounter (Signed)
Patient would like to speak with you regarding blood in stool  [phone]

## 2015-04-07 NOTE — Telephone Encounter (Signed)
Patient returned call.   States that he had large BM this AM and noted a small portion of bright red blood when he wiped.   Denies straining or pain when wiping.   Advised that large stools can sometimes cause slight tearing. Advised to pick up Tucks pads or Prep H cream for inflammation.   Advised that if bleeding worsens or persists x1 day, contact office for OV.

## 2015-04-07 NOTE — Telephone Encounter (Signed)
Call placed to patient. LMTRC.  

## 2015-04-08 ENCOUNTER — Encounter: Payer: Self-pay | Admitting: Family Medicine

## 2015-04-08 ENCOUNTER — Ambulatory Visit (INDEPENDENT_AMBULATORY_CARE_PROVIDER_SITE_OTHER): Payer: BLUE CROSS/BLUE SHIELD | Admitting: Family Medicine

## 2015-04-08 VITALS — BP 132/68 | HR 78 | Temp 98.3°F | Resp 14 | Ht 71.0 in | Wt 286.0 lb

## 2015-04-08 DIAGNOSIS — K625 Hemorrhage of anus and rectum: Secondary | ICD-10-CM

## 2015-04-08 MED ORDER — HYDROCORTISONE ACE-PRAMOXINE 1-1 % RE CREA: 1.0000 "application " | TOPICAL_CREAM | Freq: Every day | RECTAL | Status: DC

## 2015-04-08 NOTE — Patient Instructions (Addendum)
Take the stool softener twice a day for the next week Use the suppository ( 1/2 tube) once a day for 5 days Sitz bath- Epsom salt with warm water Call if this returns  F/U as previous

## 2015-04-08 NOTE — Progress Notes (Signed)
Patient ID: John Cisneros, male   DOB: [birth year], 34 y.o.   MRN: [id number]   Subjective:    Patient ID: John Cisneros, male    DOB: [birth year], 34 y.o.   MRN: [id number]  Patient presents for Rectal Bleeding rectal bleedig or the past 2 days. He had a hard stool yesterday, but denies constipation or pain with BM otherwise. No previous rectal bleeding, no abdominal pain. He noted blood when he wiped    Review Of Systems:  GEN- denies fatigue, fever, weight loss,weakness, recent illness HEENT- denies eye drainage, change in vision, nasal discharge, CVS- denies chest pain, palpitations RESP- denies SOB, cough, wheeze ABD- denies N/V, change in stools, abd pain GU- denies dysuria, hematuria, dribbling, incontinence Neuro- denies headache, dizziness, syncope, seizure activity       Objective:    BP 132/68 mmHg  Pulse 78  Temp(Src) 98.3 F (36.8 C) (Oral)  Resp 14  Ht 5\' 11"  (1.803 m)  Wt 286 lb (129.729 kg)  BMI 39.91 kg/m2 GEN- NAD, alert and oriented x3 ABD-NABS,soft,NT,ND Rectum- normal tone, normal external apperance, soft stool in vault, no gross blood hemoccult Positive, prostate smooth no nodules, no tears noted        Assessment & Plan:      Problem List Items Addressed This Visit    None    Visit Diagnoses    Rectal bleeding    -  Primary     Likley from hard stool, small internal hemorroid , will give analpram from office for 5 days, stool softner for next week, increase water.        Note: This dictation was prepared with Dragon dictation along with smaller phrase technology. Any transcriptional errors that result from this process are unintentional.

## 2015-04-29 ENCOUNTER — Telehealth: Payer: Self-pay | Admitting: Family Medicine

## 2015-04-29 ENCOUNTER — Other Ambulatory Visit: Payer: Self-pay | Admitting: Family Medicine

## 2015-04-29 ENCOUNTER — Ambulatory Visit (INDEPENDENT_AMBULATORY_CARE_PROVIDER_SITE_OTHER): Payer: BLUE CROSS/BLUE SHIELD

## 2015-04-29 ENCOUNTER — Ambulatory Visit (INDEPENDENT_AMBULATORY_CARE_PROVIDER_SITE_OTHER): Payer: BLUE CROSS/BLUE SHIELD | Admitting: Family Medicine

## 2015-04-29 VITALS — BP 129/84 | HR 96 | Temp 98.7°F | Resp 20 | Ht 72.0 in | Wt 290.4 lb

## 2015-04-29 DIAGNOSIS — R0602 Shortness of breath: Secondary | ICD-10-CM | POA: Diagnosis not present

## 2015-04-29 DIAGNOSIS — J45909 Unspecified asthma, uncomplicated: Secondary | ICD-10-CM

## 2015-04-29 MED ORDER — ALBUTEROL SULFATE HFA 108 (90 BASE) MCG/ACT IN AERS: 2.0000 | INHALATION_SPRAY | RESPIRATORY_TRACT | Status: DC | PRN

## 2015-04-29 MED ORDER — GABAPENTIN 100 MG PO CAPS: 100.0000 mg | ORAL_CAPSULE | Freq: Three times a day (TID) | ORAL | Status: DC

## 2015-04-29 NOTE — Telephone Encounter (Signed)
Call placed to patient and patient made aware.   Prescription sent to pharmacy.  

## 2015-04-29 NOTE — Telephone Encounter (Signed)
Trial of gabapentin 100mg  at bedtime, this is a nerve blocking medication Take for 4 weeks, see if any change in the tingling.

## 2015-04-29 NOTE — Telephone Encounter (Signed)
Call placed to patient. LMTRC.  

## 2015-04-29 NOTE — Telephone Encounter (Signed)
Patient is calling to speak with you regarding a sickness he is having  [phone]

## 2015-04-29 NOTE — Telephone Encounter (Signed)
Patient returned call.   States that he has been having some head congestion and some chest congestion.   Advised to use OTC Mucinex for chest congestion and nasal saline for nasal congestion. Advised to increase rest and to increase fluid intake. Recommended that if symptoms worsen or persist >1 week to contact office for visit.    Also states that tingling in legs continues with no relief. MD please advise.

## 2015-04-29 NOTE — Telephone Encounter (Signed)
Refill appropriate and filled per protocol. 

## 2015-04-29 NOTE — Patient Instructions (Signed)
Use 2 inhalations as directed every 4-6 hours  If the shortness of breath gets worse please return or go to the emergency room if necessary  Drink plenty of fluids which helps keep any mucus thinner.  Return at anytime if needed.

## 2015-04-29 NOTE — Progress Notes (Signed)
Patient ID: John Cisneros, male    DOB: [birth year]  Age: 35 y.o. MRN: [id number]  Chief Complaint  Patient presents with  . Breathing Problem    Subjective:   35 year old man who works at [employer]. For the last 4 days or so he is felt shortness of breath. He is not wheezing or coughing much. Maybe a little cough. He has not had an upper respiratory infection. No fevers. His wife has not been ill. He may feel a little tightness in his chest but not much. The main thing is just been the shortness of breath. He notices it in a random fashion throughout the day. Sometimes when he walks. He is a former smoker, does not smoke now. He is generally healthy.  Current allergies, medications, problem list, past/family and social histories reviewed.  Objective:  BP 129/84 mmHg  Pulse 96  Temp(Src) 98.7 F (37.1 C) (Oral)  Resp 20  Ht 6' (1.829 m)  Wt 290 lb 6.4 oz (131.725 kg)  BMI 39.38 kg/m2  SpO2 98%  No acute distress. Looks comfortable. TMs both have some dry wax in them. Throat was clear. Neck supple without significant nodes. Chest is clear to auscultation. No wheezes could be heard. Heart regular without murmurs. Peak flow testing was done with a maximum of 550 and predicted maximum of 620.  Assessment & Plan:   Assessment: 1. Shortness of breath   2. Mild reactive airways disease       Plan: We'll get a chest x-ray and EKG and then decide treatment.  Orders Placed This Encounter  Procedures  . DG Chest 2 View    Order Specific Question:  Reason for Exam (SYMPTOM  OR DIAGNOSIS REQUIRED)    Answer:  short of breath    Order Specific Question:  Preferred imaging location?    Answer:  External  . EKG 12-Lead    Meds ordered this encounter  Medications  . lisinopril (PRINIVIL,ZESTRIL) 10 MG tablet    Sig: Take 10 mg by mouth daily.  Marland Kitchen. albuterol (PROVENTIL HFA;VENTOLIN HFA) 108 (90 Base) MCG/ACT inhaler    Sig: Inhale 2 puffs into the lungs every 4 (four) hours as needed  for wheezing or shortness of breath (cough, shortness of breath or wheezing.).    Dispense:  1 Inhaler    Refill:  1    [facility] reading (PRIMARY) by  Dr. Alwyn RenHopper Normal lungs  Normal EKG  .       Patient Instructions  Use 2 inhalations as directed every 4-6 hours  If the shortness of breath gets worse please return or go to the emergency room if necessary  Drink plenty of fluids which helps keep any mucus thinner.  Return at anytime if needed.     Return if symptoms worsen or fail to improve.   John Cargo, MD 04/29/2015

## 2015-06-13 ENCOUNTER — Other Ambulatory Visit: Payer: Self-pay | Admitting: Family Medicine

## 2015-06-16 NOTE — Telephone Encounter (Signed)
Refill appropriate and filled per protocol. 

## 2015-10-09 ENCOUNTER — Other Ambulatory Visit: Payer: Self-pay | Admitting: Family Medicine

## 2015-10-10 ENCOUNTER — Encounter: Payer: Self-pay | Admitting: Family Medicine

## 2015-10-10 NOTE — Telephone Encounter (Signed)
Medication refill for one time only.  Patient needs to be seen.  Letter sent for patient to call and schedule 

## 2015-10-10 NOTE — Telephone Encounter (Signed)
Patient called stating he will be out of this medication tomorrow.

## 2015-10-29 ENCOUNTER — Encounter: Payer: Self-pay | Admitting: Family Medicine

## 2015-10-29 ENCOUNTER — Ambulatory Visit (INDEPENDENT_AMBULATORY_CARE_PROVIDER_SITE_OTHER): Payer: BLUE CROSS/BLUE SHIELD | Admitting: Family Medicine

## 2015-10-29 VITALS — BP 142/80 | HR 88 | Temp 98.6°F | Resp 16 | Ht 72.0 in | Wt 293.0 lb

## 2015-10-29 DIAGNOSIS — M549 Dorsalgia, unspecified: Secondary | ICD-10-CM

## 2015-10-29 DIAGNOSIS — H00013 Hordeolum externum right eye, unspecified eyelid: Secondary | ICD-10-CM

## 2015-10-29 DIAGNOSIS — E669 Obesity, unspecified: Secondary | ICD-10-CM | POA: Diagnosis not present

## 2015-10-29 DIAGNOSIS — I10 Essential (primary) hypertension: Secondary | ICD-10-CM

## 2015-10-29 LAB — COMPREHENSIVE METABOLIC PANEL
ALT: 43 U/L (ref 9–46)
AST: 26 U/L (ref 10–40)
Albumin: 4.4 g/dL (ref 3.6–5.1)
Alkaline Phosphatase: 61 U/L (ref 40–115)
BILIRUBIN TOTAL: 0.5 mg/dL (ref 0.2–1.2)
BUN: 17 mg/dL (ref 7–25)
CALCIUM: 9.2 mg/dL (ref 8.6–10.3)
CO2: 23 mmol/L (ref 20–31)
CREATININE: 1.08 mg/dL (ref 0.60–1.35)
Chloride: 105 mmol/L (ref 98–110)
GLUCOSE: 75 mg/dL (ref 70–99)
Potassium: 4.2 mmol/L (ref 3.5–5.3)
SODIUM: 139 mmol/L (ref 135–146)
Total Protein: 7.2 g/dL (ref 6.1–8.1)

## 2015-10-29 LAB — CBC WITH DIFFERENTIAL/PLATELET
BASOS ABS: 109 {cells}/uL (ref 0–200)
BASOS PCT: 1 %
EOS ABS: 327 {cells}/uL (ref 15–500)
Eosinophils Relative: 3 %
HEMATOCRIT: 46.9 % (ref 38.5–50.0)
Hemoglobin: 15.8 g/dL (ref 13.0–17.0)
LYMPHS PCT: 20 %
Lymphs Abs: 2180 cells/uL (ref 850–3900)
MCH: 31.2 pg (ref 27.0–33.0)
MCHC: 33.7 g/dL (ref 32.0–36.0)
MCV: 92.7 fL (ref 80.0–100.0)
MONO ABS: 1090 {cells}/uL — AB (ref 200–950)
MONOS PCT: 10 %
MPV: 10.2 fL (ref 7.5–12.5)
NEUTROS ABS: 7194 {cells}/uL (ref 1500–7800)
Neutrophils Relative %: 66 %
Platelets: 319 10*3/uL (ref 140–400)
RBC: 5.06 MIL/uL (ref 4.20–5.80)
RDW: 12.8 % (ref 11.0–15.0)
WBC: 10.9 10*3/uL — ABNORMAL HIGH (ref 3.8–10.8)

## 2015-10-29 MED ORDER — POLYMYXIN B-TRIMETHOPRIM 10000-0.1 UNIT/ML-% OP SOLN: 1.0000 [drp] | Freq: Four times a day (QID) | OPHTHALMIC | Status: DC

## 2015-10-29 MED ORDER — TRAMADOL HCL 50 MG PO TABS: 50.0000 mg | ORAL_TABLET | Freq: Three times a day (TID) | ORAL | Status: DC | PRN

## 2015-10-29 MED ORDER — IBUPROFEN 600 MG PO TABS: 600.0000 mg | ORAL_TABLET | Freq: Three times a day (TID) | ORAL | Status: DC | PRN

## 2015-10-29 MED ORDER — ESOMEPRAZOLE MAGNESIUM 20 MG PO CPDR
20.0000 mg | DELAYED_RELEASE_CAPSULE | Freq: Every day | ORAL | Status: DC
Start: 2015-10-29 — End: 2017-03-01

## 2015-10-29 MED ORDER — CYCLOBENZAPRINE HCL 10 MG PO TABS: 10.0000 mg | ORAL_TABLET | Freq: Three times a day (TID) | ORAL | Status: DC | PRN

## 2015-10-29 NOTE — Patient Instructions (Addendum)
F/U 4 weeks Take flexeril and ibuprofen, if severe take Ultram Work on weight loss Call if not improved in 2-3 weeks and xray will be done Use warm compresses on your eye, if not improved or you notice drainage after next few days, start eye drops Take nexium for your stomach Work on weight loss

## 2015-10-29 NOTE — Assessment & Plan Note (Signed)
BP a little elevated today in setting of weight gain, work on diet and weight loss, before adding 3rd med, check renal function Discussed risk for heart disease, DM with obesity

## 2015-10-29 NOTE — Progress Notes (Signed)
Patient ID: John MessierCortney T Cisneros, male   DOB: 1981-02-06, 35 y.o.   MRN: [id number]    Subjective:    Patient ID: John Cisneros, male    DOB: 1981-02-06, 35 y.o.   MRN: [id number]  Patient presents for Medication Review/ Refill; Back Pain; Stye; and Strange Feeling in Throat Patient here with multiple concerns. He is due to follow up his medications for blood pressure he's been taking as prescribed. He has gained more weight he is almost 300 pounds. He admits to not watching his diet he is not exercising. He states that his back is been hard for the past couple months which is why he has not been exercising denies any radiating symptoms is more pain and spasm in lower back no injury  He has a stye on his right upper lid that has been present for about a week denies any pain no drainage, no home remedies tried  From time to time he has a strange sensation in his throat when he feels like there is something in his throat he does get reflux and also some belching   Review Of Systems:  GEN- denies fatigue, fever, weight loss,weakness, recent illness HEENT- denies eye drainage, change in vision, nasal discharge, CVS- denies chest pain, palpitations RESP- denies SOB, cough, wheeze ABD- denies N/V, change in stools, abd pain GU- denies dysuria, hematuria, dribbling, incontinence MSK- + joint pain, muscle aches, injury Neuro- denies headache, dizziness, syncope, seizure activity       Objective:    BP 142/80 mmHg  Pulse 88  Temp(Src) 98.6 F (37 C) (Oral)  Resp 16  Ht 6' (1.829 m)  Wt 293 lb (132.904 kg)  BMI 39.73 kg/m2 GEN- NAD, alert and oriented x3,obese HEENT- PERRL, EOMI, non injected sclera, pink conjunctiva, MMM, oropharynx clear, stye Right upper lid, mild injection of conjunctiva, Neck- Supple, no LAD, no thryomegaly  CVS- RRR, no murmur RESP-CTAB ABD-NABS,soft,NT,ND MSK- Spine NT, Good ROM, neg SLR, mild bilat paraspinal spasm  EXT- No edema Pulses- Radial, DP-  2+        Assessment & Plan:      Problem List Items Addressed This Visit    Obesity   Essential hypertension - Primary    BP a little elevated today in setting of weight gain, work on diet and weight loss, before adding 3rd med, check renal function Discussed risk for heart disease, DM with obesity      Relevant Orders   CBC with Differential/Platelet   Comprehensive metabolic panel    Other Visit Diagnoses    Acute back pain        no redl flags, I think weight is major factor, given NSAIDS, muscle relaxer, short course Ultram due to BP, and history of proteinuria do not want to push a lot of NSAIDS    Relevant Medications    cyclobenzaprine (FLEXERIL) 10 MG tablet    ibuprofen (ADVIL,MOTRIN) 600 MG tablet    traMADol (ULTRAM) 50 MG tablet    Stye external, right        no sign of infection yet, given script for drop in case he does not improve, warm compresess       Note: This dictation was prepared with Dragon dictation along with smaller phrase technology. Any transcriptional errors that result from this process are unintentional.

## 2015-11-10 ENCOUNTER — Other Ambulatory Visit: Payer: Self-pay | Admitting: Family Medicine

## 2015-11-10 NOTE — Telephone Encounter (Signed)
Refill appropriate and filled per protocol. 

## 2016-02-08 ENCOUNTER — Other Ambulatory Visit: Payer: Self-pay | Admitting: Family Medicine

## 2016-03-08 ENCOUNTER — Other Ambulatory Visit: Payer: Self-pay | Admitting: Family Medicine

## 2016-04-06 ENCOUNTER — Other Ambulatory Visit: Payer: Self-pay | Admitting: Family Medicine

## 2016-05-07 ENCOUNTER — Other Ambulatory Visit: Payer: Self-pay | Admitting: Family Medicine

## 2016-05-07 ENCOUNTER — Encounter: Payer: Self-pay | Admitting: Family Medicine

## 2016-06-07 ENCOUNTER — Other Ambulatory Visit: Payer: Self-pay | Admitting: Family Medicine

## 2016-07-07 ENCOUNTER — Other Ambulatory Visit: Payer: Self-pay | Admitting: Family Medicine

## 2016-07-08 NOTE — Telephone Encounter (Signed)
Medication filled x1 with no refills.   Requires office visit before any further refills can be given.   Letter sent.  

## 2016-07-25 IMAGING — CR DG CHEST 2V
2 series · 2 of 2 positions shown · non-contrast
Comparison: 08/01/2013

CLINICAL DATA: Short of breath.

EXAM:
CHEST  2 VIEW

[PA]
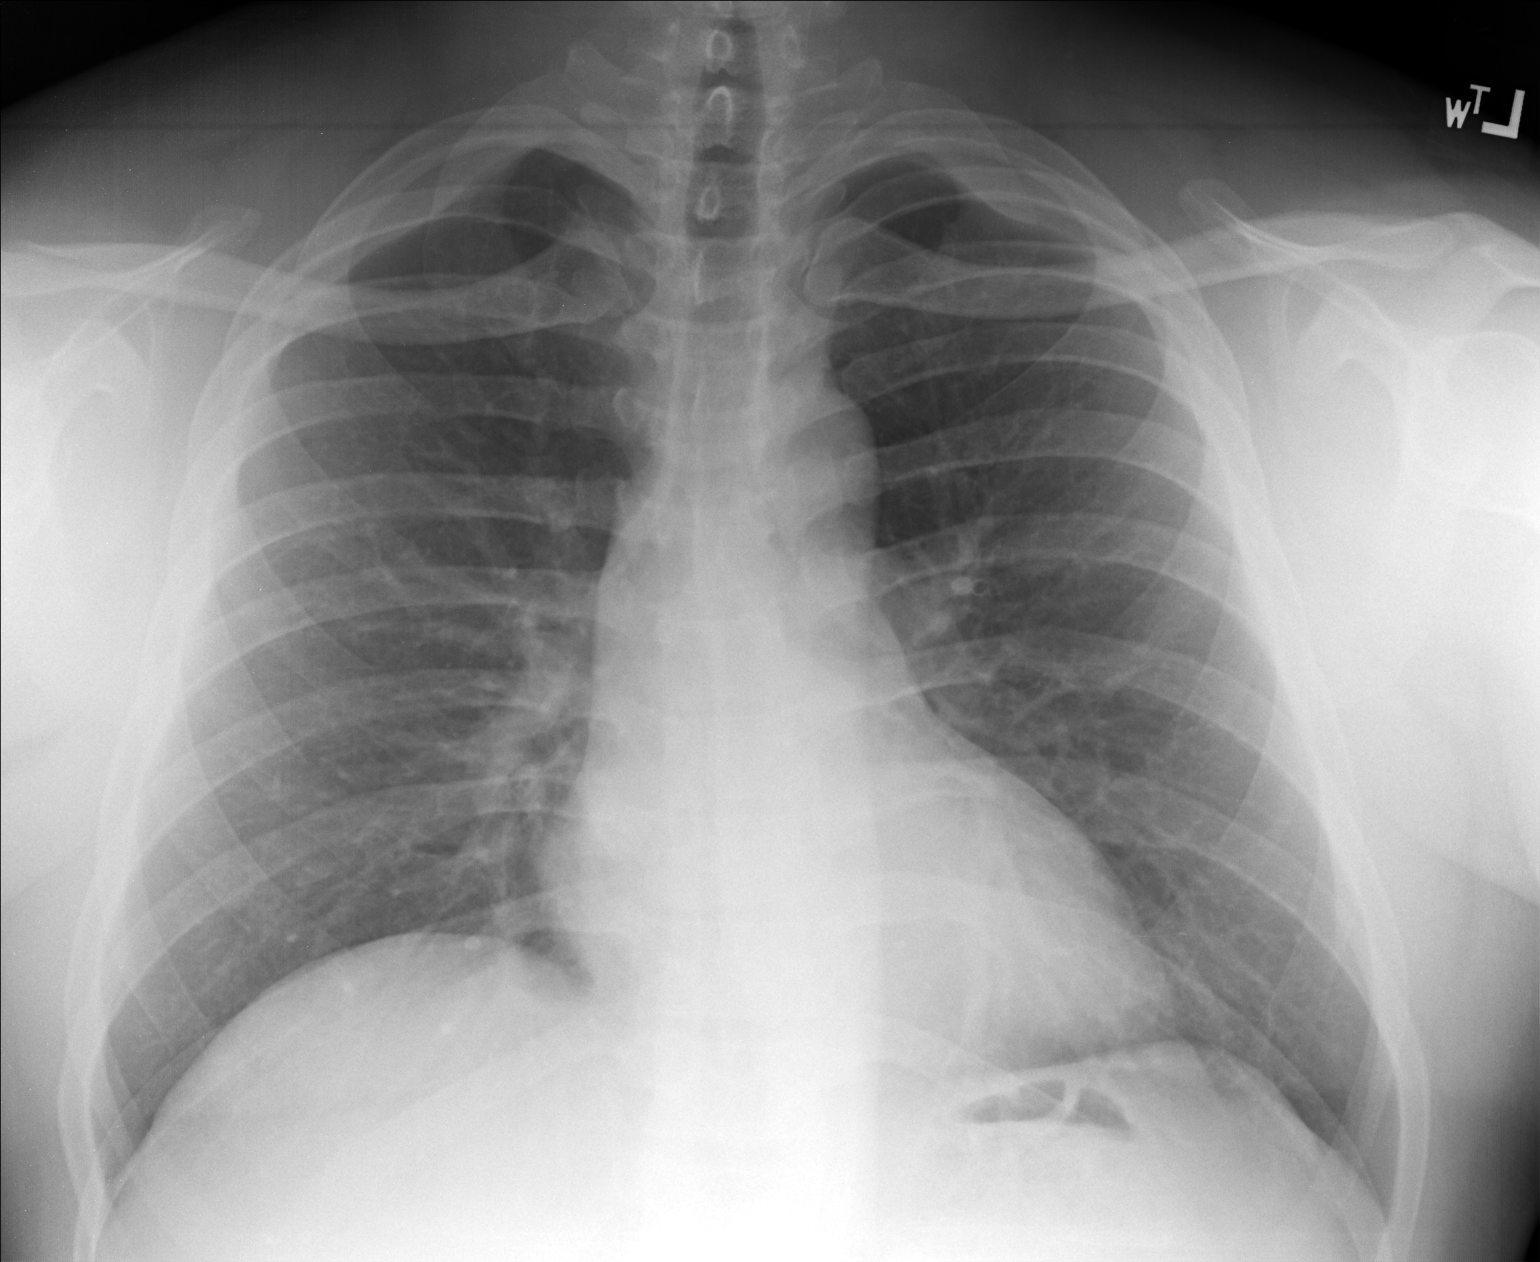

[lateral]
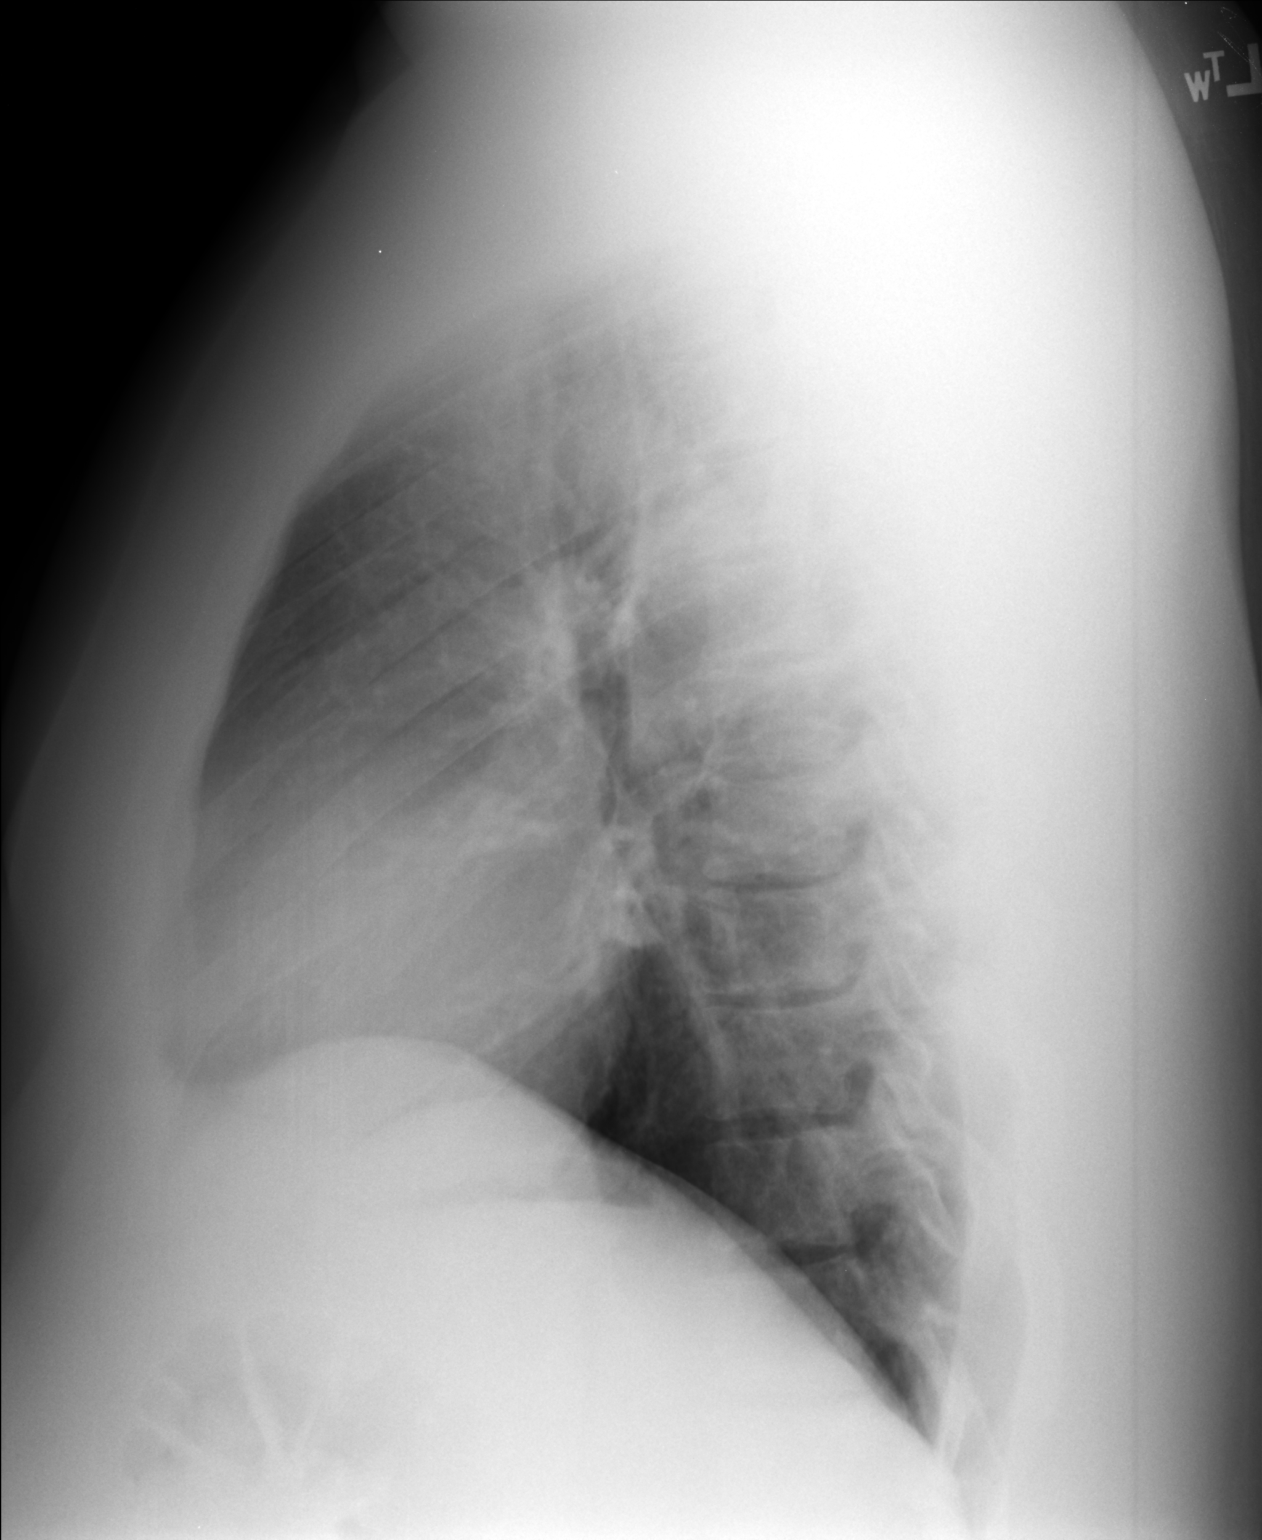

[2 of 2 positions shown; findings below may reference images not displayed]

FINDINGS: Normal mediastinum and cardiac silhouette. Normal pulmonary
vasculature. No evidence of effusion, infiltrate, or pneumothorax.
No acute bony abnormality.
IMPRESSION: Normal chest radiograph

## 2016-08-05 ENCOUNTER — Other Ambulatory Visit: Payer: Self-pay | Admitting: Family Medicine

## 2016-09-02 ENCOUNTER — Other Ambulatory Visit: Payer: Self-pay | Admitting: Family Medicine

## 2016-10-04 ENCOUNTER — Other Ambulatory Visit: Payer: Self-pay | Admitting: Family Medicine

## 2016-11-04 ENCOUNTER — Other Ambulatory Visit: Payer: Self-pay | Admitting: Family Medicine

## 2016-12-02 ENCOUNTER — Other Ambulatory Visit: Payer: Self-pay | Admitting: Family Medicine

## 2017-01-03 ENCOUNTER — Other Ambulatory Visit: Payer: Self-pay | Admitting: Family Medicine

## 2017-01-03 NOTE — Telephone Encounter (Signed)
Refill appropriate 

## 2017-01-31 ENCOUNTER — Other Ambulatory Visit: Payer: Self-pay | Admitting: Family Medicine

## 2017-01-31 NOTE — Telephone Encounter (Signed)
Medication filled x1 with no refills.   Requires office visit before any further refills can be given.   Letter sent.  

## 2017-03-01 ENCOUNTER — Encounter: Payer: Self-pay | Admitting: Family Medicine

## 2017-03-01 ENCOUNTER — Ambulatory Visit (INDEPENDENT_AMBULATORY_CARE_PROVIDER_SITE_OTHER): Payer: BLUE CROSS/BLUE SHIELD | Admitting: Family Medicine

## 2017-03-01 VITALS — BP 138/78 | HR 86 | Temp 98.8°F | Resp 14 | Ht 72.0 in | Wt 306.0 lb

## 2017-03-01 DIAGNOSIS — M67432 Ganglion, left wrist: Secondary | ICD-10-CM

## 2017-03-01 DIAGNOSIS — H6123 Impacted cerumen, bilateral: Secondary | ICD-10-CM

## 2017-03-01 DIAGNOSIS — Z0000 Encounter for general adult medical examination without abnormal findings: Secondary | ICD-10-CM

## 2017-03-01 DIAGNOSIS — I10 Essential (primary) hypertension: Secondary | ICD-10-CM

## 2017-03-01 DIAGNOSIS — Z6841 Body Mass Index (BMI) 40.0 and over, adult: Secondary | ICD-10-CM | POA: Diagnosis not present

## 2017-03-01 NOTE — Assessment & Plan Note (Signed)
Discussed healthy eating, meal prep, which wife is doing He signed up for gym again as well Goal to lose at least 15 lbs by visit in 3 months

## 2017-03-01 NOTE — Progress Notes (Signed)
   Subjective:    Patient ID: John MessierCortney T Cisneros, male    DOB: 07-31-1980, 36 y.o.   MRN: [id number]  Patient presents for CPE (is fasting) and Ganglion Cyst (L wrist)  Pt here for CPE, fasting labs  He has cyst on left wrist present  HTN- taking lisinopril and Norvasc  10mg    Immunizations- TDAP- UTD    Declines flu shot /HIV screening   He admits to eating out a lot, not exercising, eating late around 9pm after he gets home from work. His wife has lost 30lbs by changing diet. He is now ready to get on board after seeing his weight > 300lbs today. Heart disease in grandparents   Has enlarging cyst on left wrist, non tender but he does hit it while working   Wax impaction    Review Of Systems:  GEN- denies fatigue, fever, weight loss,weakness, recent illness HEENT- denies eye drainage, change in vision, nasal discharge, CVS- denies chest pain, palpitations RESP- denies SOB, cough, wheeze ABD- denies N/V, change in stools, abd pain GU- denies dysuria, hematuria, dribbling, incontinence MSK- denies joint pain, muscle aches, injury Neuro- denies headache, dizziness, syncope, seizure activity       Objective:    BP 138/78   Pulse 86   Temp 98.8 F (37.1 C) (Oral)   Resp 14   Ht 6' (1.829 m)   Wt (!) 306 lb (138.8 kg)   SpO2 98%   BMI 41.50 kg/m  GEN- NAD, alert and oriented x3 HEENT- PERRL, EOMI, non injected sclera, pink conjunctiva, MMM, oropharynx clear , bilat canals wax impaction  Neck- Supple, no thyromegaly CVS- RRR, no murmur RESP-CTAB ABD-NABS,soft,NT,ND EXT- No edema, left wrist, marble size ganglion cyst  Pulses- Radial, DP- 2+  Phq 9 score 4        Assessment & Plan:      Problem List Items Addressed This Visit      Unprioritized   Essential hypertension   Obesity    Discussed healthy eating, meal prep, which wife is doing He signed up for gym again as well Goal to lose at least 15 lbs by visit in 3 months       Other Visit Diagnoses    Routine general medical examination at a health care facility    -  Primary   CPE done, fasting labs obtained, blood pressure looks okay, no change to lisinopril norvasc. S/P cerumen irrigation . counseled on dietary changes   Relevant Orders   CBC with Differential/Platelet   Comprehensive metabolic panel   Lipid panel   Ganglion cyst of wrist, left       Referral to hand surgeon for removal   Relevant Orders   Ambulatory referral to Hand Surgery   Bilateral impacted cerumen          Note: This dictation was prepared with Dragon dictation along with smaller phrase technology. Any transcriptional errors that result from this process are unintentional.

## 2017-03-01 NOTE — Patient Instructions (Addendum)
I recommend eye visit once a year I recommend dental visit every 6 months Goal is to  Exercise 30 minutes 5 days a week - GOAL 15LBS by next visit  We call with lab results  Work on diet  Referral to hand surgeon  F/U 3 months

## 2017-03-02 LAB — CBC WITH DIFFERENTIAL/PLATELET
Basophils Absolute: 58 cells/uL (ref 0–200)
Basophils Relative: 0.5 %
EOS PCT: 2.2 %
Eosinophils Absolute: 255 cells/uL (ref 15–500)
HEMATOCRIT: 49 % (ref 38.5–50.0)
HEMOGLOBIN: 16.6 g/dL (ref 13.2–17.1)
LYMPHS ABS: 2517 {cells}/uL (ref 850–3900)
MCH: 30.9 pg (ref 27.0–33.0)
MCHC: 33.9 g/dL (ref 32.0–36.0)
MCV: 91.2 fL (ref 80.0–100.0)
MONOS PCT: 7.9 %
MPV: 10.6 fL (ref 7.5–12.5)
NEUTROS PCT: 67.7 %
Neutro Abs: 7853 cells/uL — ABNORMAL HIGH (ref 1500–7800)
Platelets: 304 10*3/uL (ref 140–400)
RBC: 5.37 10*6/uL (ref 4.20–5.80)
RDW: 11.8 % (ref 11.0–15.0)
TOTAL LYMPHOCYTE: 21.7 %
WBC mixed population: 916 cells/uL (ref 200–950)
WBC: 11.6 10*3/uL — AB (ref 3.8–10.8)

## 2017-03-02 LAB — COMPREHENSIVE METABOLIC PANEL
AG RATIO: 1.4 (calc) (ref 1.0–2.5)
ALT: 42 U/L (ref 9–46)
AST: 25 U/L (ref 10–40)
Albumin: 4.3 g/dL (ref 3.6–5.1)
Alkaline phosphatase (APISO): 64 U/L (ref 40–115)
BILIRUBIN TOTAL: 0.7 mg/dL (ref 0.2–1.2)
BUN: 15 mg/dL (ref 7–25)
CALCIUM: 9.8 mg/dL (ref 8.6–10.3)
CO2: 27 mmol/L (ref 20–32)
Chloride: 101 mmol/L (ref 98–110)
Creat: 1.34 mg/dL (ref 0.60–1.35)
GLUCOSE: 97 mg/dL (ref 65–99)
Globulin: 3.1 g/dL (calc) (ref 1.9–3.7)
Potassium: 4.7 mmol/L (ref 3.5–5.3)
Sodium: 137 mmol/L (ref 135–146)
Total Protein: 7.4 g/dL (ref 6.1–8.1)

## 2017-03-02 LAB — LIPID PANEL
Cholesterol: 118 mg/dL (ref <200)
HDL: 31 mg/dL — AB (ref >40)
LDL Cholesterol (Calc): 63 mg/dL (calc)
Non-HDL Cholesterol (Calc): 87 mg/dL (calc) (ref <130)
TRIGLYCERIDES: 165 mg/dL — AB (ref <150)
Total CHOL/HDL Ratio: 3.8 (calc) (ref <5.0)

## 2017-03-03 ENCOUNTER — Encounter: Payer: Self-pay | Admitting: *Deleted

## 2017-03-03 ENCOUNTER — Other Ambulatory Visit: Payer: Self-pay | Admitting: Family Medicine

## 2017-03-31 ENCOUNTER — Other Ambulatory Visit: Payer: Self-pay | Admitting: Family Medicine

## 2017-05-02 ENCOUNTER — Other Ambulatory Visit: Payer: Self-pay | Admitting: Family Medicine

## 2017-06-02 ENCOUNTER — Other Ambulatory Visit: Payer: Self-pay | Admitting: Family Medicine

## 2017-07-03 LAB — HM DIABETES EYE EXAM

## 2017-07-04 ENCOUNTER — Other Ambulatory Visit: Payer: Self-pay | Admitting: Family Medicine

## 2017-08-03 ENCOUNTER — Other Ambulatory Visit: Payer: Self-pay | Admitting: *Deleted

## 2017-08-03 MED ORDER — AMLODIPINE BESYLATE 10 MG PO TABS: 10.0000 mg | ORAL_TABLET | Freq: Every day | ORAL | 0 refills | Status: DC

## 2017-08-03 MED ORDER — LISINOPRIL 20 MG PO TABS: 20.0000 mg | ORAL_TABLET | Freq: Every day | ORAL | 0 refills | Status: DC

## 2017-08-11 ENCOUNTER — Encounter: Payer: Self-pay | Admitting: Family Medicine

## 2017-08-11 ENCOUNTER — Ambulatory Visit (INDEPENDENT_AMBULATORY_CARE_PROVIDER_SITE_OTHER): Payer: BLUE CROSS/BLUE SHIELD | Admitting: Family Medicine

## 2017-08-11 ENCOUNTER — Other Ambulatory Visit: Payer: Self-pay

## 2017-08-11 VITALS — BP 134/92 | HR 93 | Temp 98.4°F | Resp 16 | Ht 72.0 in | Wt 314.5 lb

## 2017-08-11 DIAGNOSIS — S86812A Strain of other muscle(s) and tendon(s) at lower leg level, left leg, initial encounter: Secondary | ICD-10-CM | POA: Diagnosis not present

## 2017-08-11 MED ORDER — NAPROXEN 500 MG PO TABS: 500.0000 mg | ORAL_TABLET | Freq: Two times a day (BID) | ORAL | 0 refills | Status: DC

## 2017-08-11 NOTE — Patient Instructions (Signed)
Rest, Ice, take naproxen  RICE for Routine Care of Injuries Many injuries can be cared for using rest, ice, compression, and elevation (RICE therapy). Using RICE therapy can help to lessen pain and swelling. It can help your body to heal. Rest Reduce your normal activities and avoid using the injured part of your body. You can go back to your normal activities when you feel okay and your doctor says it is okay. Ice Do not put ice on your bare skin.  Put ice in a plastic bag.  Place a towel between your skin and the bag.  Leave the ice on for 20 minutes, 2-3 times a day.  Do this for as long as told by your doctor. Compression Compression means putting pressure on the injured area. This can be done with an elastic bandage. If an elastic bandage has been applied:  Remove and reapply the bandage every 3-4 hours or as told by your doctor.  Make sure the bandage is not wrapped too tight. Wrap the bandage more loosely if part of your body beyond the bandage is blue, swollen, cold, painful, or loses feeling (numb).  See your doctor if the bandage seems to make your problems worse.  Elevation Elevation means keeping the injured area raised. Raise the injured area above your heart or the center of your chest if you can. When should I get help? You should get help if:  You keep having pain and swelling.  Your symptoms get worse.  Get help right away if: You should get help right away if:  You have sudden bad pain at or below the area of your injury.  You have redness or more swelling around your injury.  You have tingling or numbness at or below the injury that does not go away when you take off the bandage.  This information is not intended to replace advice given to you by your health care provider. Make sure you discuss any questions you have with your health care provider. Document Released: 09/29/2007 Document Revised: 03/09/2016 Document Reviewed: 03/20/2014 Elsevier  Interactive Patient Education  2017 Elsevier Inc.   Muscle Strain A muscle strain is an injury that occurs when a muscle is stretched beyond its normal length. Usually a small number of muscle fibers are torn when this happens. Muscle strain is rated in degrees. First-degree strains have the least amount of muscle fiber tearing and pain. Second-degree and third-degree strains have increasingly more tearing and pain. Usually, recovery from muscle strain takes 1-2 weeks. Complete healing takes 5-6 weeks. What are the causes? Muscle strain happens when a sudden, violent force placed on a muscle stretches it too far. This may occur with lifting, sports, or a fall. What increases the risk? Muscle strain is especially common in athletes. What are the signs or symptoms? At the site of the muscle strain, there may be:  Pain.  Bruising.  Swelling.  Difficulty using the muscle due to pain or lack of normal function.  How is this diagnosed? Your health care provider will perform a physical exam and ask about your medical history. How is this treated? Often, the best treatment for a muscle strain is resting, icing, and applying cold compresses to the injured area. Follow these instructions at home:  Use the PRICE method of treatment to promote muscle healing during the first 2-3 days after your injury. The PRICE method involves: ? Protecting the muscle from being injured again. ? Restricting your activity and resting the injured body part. ?  Icing your injury. To do this, put ice in a plastic bag. Place a towel between your skin and the bag. Then, apply the ice and leave it on from 15-20 minutes each hour. After the third day, switch to moist heat packs. ? Apply compression to the injured area with a splint or elastic bandage. Be careful not to wrap it too tightly. This may interfere with blood circulation or increase swelling. ? Elevate the injured body part above the level of your heart as  often as you can.  Only take over-the-counter or prescription medicines for pain, discomfort, or fever as directed by your health care provider.  Warming up prior to exercise helps to prevent future muscle strains. Contact a health care provider if:  You have increasing pain or swelling in the injured area.  You have numbness, tingling, or a significant loss of strength in the injured area. This information is not intended to replace advice given to you by your health care provider. Make sure you discuss any questions you have with your health care provider. Document Released: 04/12/2005 Document Revised: 09/18/2015 Document Reviewed: 11/09/2012 Elsevier Interactive Patient Education  2017 [employer].

## 2017-08-11 NOTE — Progress Notes (Signed)
Patient ID: John MessierCortney T Labella, male    DOB: 07-12-1980, 37 y.o.   MRN: [id number]  PCP: Salley Scarleturham, Kawanta F, MD  Chief Complaint  Patient presents with  . Leg Pain    Patient in today with c/o pain to left calf. Onset yesterday.     Subjective:   John Cisneros is a 37 y.o. male, presents to clinic with CC of left calf pain onset yesterday.  Patient states is a gradual onset he felt some minor pain when going to bed 2 nights ago and woke up with slightly more pain located in his left calf.  2 days ago he went to the gym for the first time a long time and did a lot of walking and running on the treadmill, try to get back in shape and lose weight.  He also had bent down cleaning his car squatting very deeply.  His pain is intermittent, 6 out of 10 at its worst, described as pulling and throbbing, pain reproduced with dorsiflexion of his left foot and with going up and down stairs.  Currently he does not have any pain and he cannot press on his calf in any definite location to reproduce it.  He denies any swelling, redness, bruising.  He has going out of town tomorrow for a week, flying, so he wanted to check on it before he left. No hx of blood clots, no CA hx, no redness, swelling to left calf       Patient Active Problem List   Diagnosis Date Noted  . Fatigue 01/28/2015  . Ingrown toenail 01/08/2014  . Pityriasis rosea 01/19/2013  . 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 Seasonal allergies 05/22/2012  . Kidney stone 03/01/2011  . Microscopic hematuria 03/01/2011  . Obesity 03/15/2006  . Essential hypertension 03/15/2006     Prior to Admission medications   Medication Sig Start Date End Date Taking? Authorizing Provider  amLODipine (NORVASC) 10 MG tablet Take 1 tablet (10 mg total) by mouth daily. 08/03/17  Yes [place], Velna HatchetKawanta F, MD  CINNAMON PO Take daily by mouth.   Yes [provider]  lisinopril (PRINIVIL,ZESTRIL) 20 MG tablet Take 1 tablet (20 mg total) by mouth daily. 08/03/17   Yes , Velna HatchetKawanta F, MD  Omega-3 Fatty Acids (FISH OIL) 1000 MG CAPS Take daily by mouth.   Yes [provider]     No Known Allergies   Family History  Problem Relation Age of Onset  . Hypertension Father   . Heart disease Maternal Grandmother   . Heart disease Maternal Grandfather   . Heart disease Paternal Grandmother   . Heart disease Paternal Grandfather      Social History   Socioeconomic History  . Marital status: Married    Spouse name: Not on file  . Number of children: Not on file  . Years of education: Not on file  . Highest education level: Not on file  Occupational History  . Not on file  Social Needs  . Financial resource strain: Not on file  . Food insecurity:    Worry: Not on file    Inability: Not on file  . Transportation needs:    Medical: No    Non-medical: No  Tobacco Use  . Smoking status: Former Smoker    Packs/day: 0.50    Types: Cigarettes  . Smokeless tobacco: Never Used  Substance and Sexual Activity  . Alcohol use: Yes    Alcohol/week: 0.0 oz    Comment: occasional  .  Drug use: No  . Sexual activity: Not on file  Lifestyle  . Physical activity:    Days per week: 0 days    Minutes per session: 0 min  . Stress: Not on file  Relationships  . Social connections:    Talks on phone: Not on file    Gets together: Not on file    Attends religious service: Not on file    Active member of club or organization: Not on file    Attends meetings of clubs or organizations: Not on file    Relationship status: Married  . Intimate partner violence:    Fear of current or ex partner: Not on file    Emotionally abused: Not on file    Physically abused: Not on file    Forced sexual activity: Not on file  Other Topics Concern  . Not on file  Social History Narrative  . Not on file     Review of Systems  Constitutional: Negative.   Respiratory: Negative for chest tightness and shortness of breath.   Cardiovascular: Negative  for chest pain, palpitations and leg swelling.  Musculoskeletal: Positive for myalgias. Negative for arthralgias, back pain, gait problem and joint swelling.  Skin: Negative.  Negative for color change and pallor.  Neurological: Negative for weakness and numbness.  Hematological: Negative.   All other systems reviewed and are negative.      Objective:    Vitals:   08/11/17 1103  BP: (!) 134/92  Pulse: 93  Resp: 16  Temp: 98.4 F (36.9 C)  TempSrc: Oral  SpO2: 98%  Weight: (!) 314 lb 8 oz (142.7 kg)  Height: 6' (1.829 m)      Physical Exam  Constitutional: He appears well-developed and well-nourished. No distress.  HENT:  Head: Normocephalic and atraumatic.  Nose: Nose normal.  Eyes: Pupils are equal, round, and reactive to light. Conjunctivae and EOM are normal. Right eye exhibits no discharge. Left eye exhibits no discharge.  Neck: Normal range of motion. No tracheal deviation present.  Cardiovascular: Normal rate, regular rhythm and intact distal pulses.  Pulmonary/Chest: Effort normal. No stridor. No respiratory distress.  Musculoskeletal: Normal range of motion. He exhibits no edema or deformity.       Left knee: Normal. He exhibits normal range of motion, no swelling, no effusion, no ecchymosis, no deformity, no erythema and no bony tenderness. No tenderness found.       Left ankle: Normal. He exhibits normal range of motion, no swelling, no ecchymosis, no deformity and normal pulse. No tenderness. Achilles tendon normal. Achilles tendon exhibits no pain, no defect and normal Thompson's test results.       Left lower leg: He exhibits tenderness. He exhibits no bony tenderness, no swelling, no edema, no deformity and no laceration.  Neurological: He is alert. No sensory deficit. He exhibits normal muscle tone. Coordination normal.  Normal sensation to light touch bilaterally in lower extremities, normal strength 5 out of 5 bilaterally in lower extremities  Skin: Skin is  warm and dry. Capillary refill takes less than 2 seconds. No rash noted. He is not diaphoretic. No erythema. No pallor.  No pitting edema, no induration, no erythema, no palpable cord to the left posterior leg No varicosities, no ecchymosis  Psychiatric: He has a normal mood and affect. His behavior is normal.  Nursing note and vitals reviewed.         Assessment & Plan:      ICD-10-CM   1. Strain  of calf muscle, left, initial encounter S86.812A     Suspect muscle strain, mild, unable to reproduce with palpation here in clinic today. No concern for DVT, Wells DVT criteria negative Bilateral calf circumference is symmetrical  No indication for imaging. Treat with NSAIDs and rice therapy Reviewed concerning signs and symptoms for which he should seek urgent evaluation for.  Patient verbalized understanding.  He may follow-up in 1 to 2 weeks if not starting to improve but I did discuss with him timeline of muscle strain healing which can be anywhere from 1 to 8 weeks.   Danelle Berry, PA-C 08/11/17 11:23 AM

## 2017-08-17 ENCOUNTER — Encounter: Payer: Self-pay | Admitting: Family Medicine

## 2017-08-17 ENCOUNTER — Ambulatory Visit (INDEPENDENT_AMBULATORY_CARE_PROVIDER_SITE_OTHER): Payer: BLUE CROSS/BLUE SHIELD | Admitting: Family Medicine

## 2017-08-17 ENCOUNTER — Telehealth: Payer: Self-pay | Admitting: *Deleted

## 2017-08-17 VITALS — BP 150/100 | HR 95 | Temp 98.6°F | Resp 18 | Ht 72.0 in | Wt 317.0 lb

## 2017-08-17 DIAGNOSIS — R739 Hyperglycemia, unspecified: Secondary | ICD-10-CM

## 2017-08-17 DIAGNOSIS — E1165 Type 2 diabetes mellitus with hyperglycemia: Secondary | ICD-10-CM | POA: Diagnosis not present

## 2017-08-17 DIAGNOSIS — Z6841 Body Mass Index (BMI) 40.0 and over, adult: Secondary | ICD-10-CM

## 2017-08-17 DIAGNOSIS — I10 Essential (primary) hypertension: Secondary | ICD-10-CM | POA: Diagnosis not present

## 2017-08-17 LAB — CBC WITH DIFFERENTIAL/PLATELET
Basophils Absolute: 54 cells/uL (ref 0–200)
Basophils Relative: 0.4 %
EOS ABS: 340 {cells}/uL (ref 15–500)
Eosinophils Relative: 2.5 %
HEMATOCRIT: 46.1 % (ref 38.5–50.0)
HEMOGLOBIN: 15.8 g/dL (ref 13.2–17.1)
Lymphs Abs: 3142 cells/uL (ref 850–3900)
MCH: 30.4 pg (ref 27.0–33.0)
MCHC: 34.3 g/dL (ref 32.0–36.0)
MCV: 88.8 fL (ref 80.0–100.0)
MPV: 10.9 fL (ref 7.5–12.5)
Monocytes Relative: 8.3 %
NEUTROS ABS: 8935 {cells}/uL — AB (ref 1500–7800)
Neutrophils Relative %: 65.7 %
Platelets: 339 10*3/uL (ref 140–400)
RBC: 5.19 10*6/uL (ref 4.20–5.80)
RDW: 11.6 % (ref 11.0–15.0)
Total Lymphocyte: 23.1 %
WBC: 13.6 10*3/uL — ABNORMAL HIGH (ref 3.8–10.8)
WBCMIX: 1129 {cells}/uL — AB (ref 200–950)

## 2017-08-17 LAB — URINALYSIS, ROUTINE W REFLEX MICROSCOPIC
BILIRUBIN URINE: NEGATIVE
Bacteria, UA: NONE SEEN /HPF
Ketones, ur: NEGATIVE
Leukocytes, UA: NEGATIVE
Nitrite: NEGATIVE
SQUAMOUS EPITHELIAL / LPF: NONE SEEN /HPF (ref <=5)
Specific Gravity, Urine: 1.025 (ref 1.001–1.03)
WBC, UA: NONE SEEN /HPF (ref 0–5)
pH: 6 (ref 5.0–8.0)

## 2017-08-17 LAB — COMPLETE METABOLIC PANEL WITH GFR
AG Ratio: 1.4 (calc) (ref 1.0–2.5)
ALT: 47 U/L — AB (ref 9–46)
AST: 26 U/L (ref 10–40)
Albumin: 4.2 g/dL (ref 3.6–5.1)
Alkaline phosphatase (APISO): 85 U/L (ref 40–115)
BUN: 16 mg/dL (ref 7–25)
CALCIUM: 9.4 mg/dL (ref 8.6–10.3)
CO2: 26 mmol/L (ref 20–32)
CREATININE: 1.24 mg/dL (ref 0.60–1.35)
Chloride: 97 mmol/L — ABNORMAL LOW (ref 98–110)
GFR, EST NON AFRICAN AMERICAN: 75 mL/min/{1.73_m2} (ref >=60)
GFR, Est African American: 87 mL/min/{1.73_m2} (ref >=60)
GLOBULIN: 3 g/dL (ref 1.9–3.7)
Glucose, Bld: 290 mg/dL — ABNORMAL HIGH (ref 65–99)
Potassium: 4.3 mmol/L (ref 3.5–5.3)
SODIUM: 132 mmol/L — AB (ref 135–146)
TOTAL PROTEIN: 7.2 g/dL (ref 6.1–8.1)
Total Bilirubin: 0.4 mg/dL (ref 0.2–1.2)

## 2017-08-17 LAB — GLUCOSE 16585: GLUCOSE 2500: 314 mg/dL — AB (ref 65–99)

## 2017-08-17 LAB — MICROSCOPIC MESSAGE

## 2017-08-17 LAB — HEMOGLOBIN A1C, FINGERSTICK: Hgb A1C (fingerstick): 7.5 % OF TOTAL HGB — ABNORMAL HIGH (ref <6.0)

## 2017-08-17 MED ORDER — BLOOD GLUCOSE TEST VI STRP: ORAL_STRIP | 1 refills | Status: DC

## 2017-08-17 MED ORDER — METFORMIN HCL 500 MG PO TABS: 500.0000 mg | ORAL_TABLET | Freq: Two times a day (BID) | ORAL | 0 refills | Status: DC

## 2017-08-17 MED ORDER — BLOOD GLUCOSE SYSTEM PAK KIT: PACK | 1 refills | Status: DC

## 2017-08-17 MED ORDER — BLOOD GLUCOSE MONITOR KIT: PACK | 0 refills | Status: DC

## 2017-08-17 MED ORDER — LANCETS MISC: 1 refills | Status: DC

## 2017-08-17 MED ORDER — GLIMEPIRIDE 2 MG PO TABS: 2.0000 mg | ORAL_TABLET | Freq: Every day | ORAL | 0 refills | Status: DC

## 2017-08-17 MED ORDER — LISINOPRIL 40 MG PO TABS: 40.0000 mg | ORAL_TABLET | Freq: Every day | ORAL | 3 refills | Status: DC

## 2017-08-17 NOTE — Telephone Encounter (Signed)
Patient returned call. States that CBG readings were 328, then 413. Advised to keep afternoon appointment.

## 2017-08-17 NOTE — Patient Instructions (Addendum)
Blood Glucose Monitoring, Adult °Monitoring your blood sugar (glucose) helps you manage your diabetes. It also helps you and your health care provider determine how well your diabetes management plan is working. Blood glucose monitoring involves checking your blood glucose as often as directed, and keeping a record (log) of your results over time. °Why should I monitor my blood glucose? °Checking your blood glucose regularly can: °· Help you understand how food, exercise, illnesses, and medicines affect your blood glucose. °· Let you know what your blood glucose is at any time. You can quickly tell if you are having low blood glucose (hypoglycemia) or high blood glucose (hyperglycemia). °· Help you and your health care provider adjust your medicines as needed. ° °When should I check my blood glucose? °Follow instructions from your health care provider about how often to check your blood glucose. This may depend on: °· The type of diabetes you have. °· How well-controlled your diabetes is. °· Medicines you are taking. ° °If you have type 1 diabetes: °· Check your blood glucose at least 2 times a day. °· Also check your blood glucose: °? Before every insulin injection. °? Before and after exercise. °? Between meals. °? 2 hours after a meal. °? Occasionally between 2:00 a.m. and 3:00 a.m., as directed. °? Before potentially dangerous tasks, like driving or using heavy machinery. °? At bedtime. °· You may need to check your blood glucose more often, up to 6-10 times a day: °? If you use an insulin pump. °? If you need multiple daily injections (MDI). °? If your diabetes is not well-controlled. °? If you are ill. °? If you have a history of severe hypoglycemia. °? If you have a history of not knowing when your blood glucose is getting low (hypoglycemia unawareness). °If you have type 2 diabetes: °· If you take insulin or other diabetes medicines, check your blood glucose at least 2 times a day. °· If you are on intensive  insulin therapy, check your blood glucose at least 4 times a day. Occasionally, you may also need to check between 2:00 a.m. and 3:00 a.m., as directed. °· Also check your blood glucose: °? Before and after exercise. °? Before potentially dangerous tasks, like driving or using heavy machinery. °· You may need to check your blood glucose more often if: °? Your medicine is being adjusted. °? Your diabetes is not well-controlled. °? You are ill. °What is a blood glucose log? °· A blood glucose log is a record of your blood glucose readings. It helps you and your health care provider: °? Look for patterns in your blood glucose over time. °? Adjust your diabetes management plan as needed. °· Every time you check your blood glucose, write down your result and notes about things that may be affecting your blood glucose, such as your diet and exercise for the day. °· Most glucose meters store a record of glucose readings in the meter. Some meters allow you to download your records to a computer. °How do I check my blood glucose? °Follow these steps to get accurate readings of your blood glucose: °Supplies needed ° °· Blood glucose meter. °· Test strips for your meter. Each meter has its own strips. You must use the strips that come with your meter. °· A needle to prick your finger (lancet). Do not use lancets more than once. °· A device that holds the lancet (lancing device). °· A journal or log book to write down your results. °Procedure °·   Wash your hands with soap and water. °· Prick the side of your finger (not the tip) with the lancet. Use a different finger each time. °· Gently rub the finger until a small drop of blood appears. °· Follow instructions that come with your meter for inserting the test strip, applying blood to the strip, and using your blood glucose meter. °· Write down your result and any notes. °Alternative testing sites °· Some meters allow you to use areas of your body other than your finger  (alternative sites) to test your blood. °· If you think you may have hypoglycemia, or if you have hypoglycemia unawareness, do not use alternative sites. Use your finger instead. °· Alternative sites may not be as accurate as the fingers, because blood flow is slower in these areas. This means that the result you get may be delayed, and it may be different from the result that you would get from your finger. °· The most common alternative sites are: °? Forearm. °? Thigh. °? Palm of the hand. °Additional tips °· Always keep your supplies with you. °· If you have questions or need help, all blood glucose meters have a 24-hour “hotline” number that you can call. You may also contact your health care provider. °· After you use a few boxes of test strips, adjust (calibrate) your blood glucose meter by following instructions that came with your meter. °This information is not intended to replace advice given to you by your health care provider. Make sure you discuss any questions you have with your health care provider. °Document Released: 04/15/2003 Document Revised: 10/31/2015 Document Reviewed: 09/22/2015 °Elsevier Interactive Patient Education © 2017 Elsevier Inc. ° ° ° °Diabetes Mellitus and Nutrition °When you have diabetes (diabetes mellitus), it is very important to have healthy eating habits because your blood sugar (glucose) levels are greatly affected by what you eat and drink. Eating healthy foods in the appropriate amounts, at about the same times every day, can help you: °· Control your blood glucose. °· Lower your risk of heart disease. °· Improve your blood pressure. °· Reach or maintain a healthy weight. ° °Every person with diabetes is different, and each person has different needs for a meal plan. Your health care provider may recommend that you work with a diet and nutrition specialist (dietitian) to make a meal plan that is best for you. Your meal plan may vary depending on factors such as: °· The  calories you need. °· The medicines you take. °· Your weight. °· Your blood glucose, blood pressure, and cholesterol levels. °· Your activity level. °· Other health conditions you have, such as heart or kidney disease. ° °How do carbohydrates affect me? °Carbohydrates affect your blood glucose level more than any other type of food. Eating carbohydrates naturally increases the amount of glucose in your blood. Carbohydrate counting is a method for keeping track of how many carbohydrates you eat. Counting carbohydrates is important to keep your blood glucose at a healthy level, especially if you use insulin or take certain oral diabetes medicines. °It is important to know how many carbohydrates you can safely have in each meal. This is different for every person. Your dietitian can help you calculate how many carbohydrates you should have at each meal and for snack. °Foods that contain carbohydrates include: °· Bread, cereal, rice, pasta, and crackers. °· Potatoes and corn. °· Peas, beans, and lentils. °· Milk and yogurt. °· Fruit and juice. °· Desserts, such as cakes, cookies, ice cream,   and candy. ° °How does alcohol affect me? °Alcohol can cause a sudden decrease in blood glucose (hypoglycemia), especially if you use insulin or take certain oral diabetes medicines. Hypoglycemia can be a life-threatening condition. Symptoms of hypoglycemia (sleepiness, dizziness, and confusion) are similar to symptoms of having too much alcohol. °If your health care provider says that alcohol is safe for you, follow these guidelines: °· Limit alcohol intake to no more than 1 drink per day for nonpregnant women and 2 drinks per day for men. One drink equals 12 oz of beer, 5 oz of wine, or 1½ oz of hard liquor. °· Do not drink on an empty stomach. °· Keep yourself hydrated with water, diet soda, or unsweetened iced tea. °· Keep in mind that regular soda, juice, and other mixers may contain a lot of sugar and must be counted as  carbohydrates. ° °What are tips for following this plan? °Reading food labels °· Start by checking the serving size on the label. The amount of calories, carbohydrates, fats, and other nutrients listed on the label are based on one serving of the food. Many foods contain more than one serving per package. °· Check the total grams (g) of carbohydrates in one serving. You can calculate the number of servings of carbohydrates in one serving by dividing the total carbohydrates by 15. For example, if a food has 30 g of total carbohydrates, it would be equal to 2 servings of carbohydrates. °· Check the number of grams (g) of saturated and trans fats in one serving. Choose foods that have low or no amount of these fats. °· Check the number of milligrams (mg) of sodium in one serving. Most people should limit total sodium intake to less than 2,300 mg per day. °· Always check the nutrition information of foods labeled as "low-fat" or "nonfat". These foods may be higher in added sugar or refined carbohydrates and should be avoided. °· Talk to your dietitian to identify your daily goals for nutrients listed on the label. °Shopping °· Avoid buying canned, premade, or processed foods. These foods tend to be high in fat, sodium, and added sugar. °· Shop around the outside edge of the grocery store. This includes fresh fruits and vegetables, bulk grains, fresh meats, and fresh dairy. °Cooking °· Use low-heat cooking methods, such as baking, instead of high-heat cooking methods like deep frying. °· Cook using healthy oils, such as olive, canola, or sunflower oil. °· Avoid cooking with butter, cream, or high-fat meats. °Meal planning °· Eat meals and snacks regularly, preferably at the same times every day. Avoid going long periods of time without eating. °· Eat foods high in fiber, such as fresh fruits, vegetables, beans, and whole grains. Talk to your dietitian about how many servings of carbohydrates you can eat at each  meal. °· Eat 4-6 ounces of lean protein each day, such as lean meat, chicken, fish, eggs, or tofu. 1 ounce is equal to 1 ounce of meat, chicken, or fish, 1 egg, or 1/4 cup of tofu. °· Eat some foods each day that contain healthy fats, such as avocado, nuts, seeds, and fish. °Lifestyle ° °· Check your blood glucose regularly. °· Exercise at least 30 minutes 5 or more days each week, or as told by your health care provider. °· Take medicines as told by your health care provider. °· Do not use any products that contain nicotine or tobacco, such as cigarettes and e-cigarettes. If you need help quitting, ask your health care   provider. °· Work with a counselor or diabetes educator to identify strategies to manage stress and any emotional and social challenges. °What are some questions to ask my health care provider? °· Do I need to meet with a diabetes educator? °· Do I need to meet with a dietitian? °· What number can I call if I have questions? °· When are the best times to check my blood glucose? °Where to find more information: °· American Diabetes Association: diabetes.org/food-and-fitness/food °· Academy of Nutrition and Dietetics: www.eatright.org/resources/health/diseases-and-conditions/diabetes °· National Institute of Diabetes and Digestive and Kidney Diseases (NIH): www.niddk.nih.gov/health-information/diabetes/overview/diet-eating-physical-activity °Summary °· A healthy meal plan will help you control your blood glucose and maintain a healthy lifestyle. °· Working with a diet and nutrition specialist (dietitian) can help you make a meal plan that is best for you. °· Keep in mind that carbohydrates and alcohol have immediate effects on your blood glucose levels. It is important to count carbohydrates and to use alcohol carefully. °This information is not intended to replace advice given to you by your health care provider. Make sure you discuss any questions you have with your health care provider. °Document  Released: 01/07/2005 Document Revised: 05/17/2016 Document Reviewed: 05/17/2016 °Elsevier Interactive Patient Education © 2018 [employer] Inc. ° °

## 2017-08-17 NOTE — Assessment & Plan Note (Addendum)
Metformin + lifestyle changes, diet and exercise Glucometer, strips and lancets ordered Sugar log reviewed  Pt will need statin, possibly ASA? Has risk factors of obesity, former smoker, HTN, fx of CAD, hx of proteinuria/hematuria  CMP, CBC, A1C, lipids, urine albumin F/up in 1-2 weeks for f/up appt with Dr. Jeanice Limurham, will need to increase metformin as tolerated

## 2017-08-17 NOTE — Telephone Encounter (Signed)
Received call from patient.   Reports that CBG readings were going down. States that reading noted to be in low 300's. States that he checked it again 30 minutes later and noted to be back in 400's. Appointment scheduled with PA.

## 2017-08-17 NOTE — Assessment & Plan Note (Signed)
BP elevated today 08/17/17, 150/100, with new onset T2DM Will increase lisinopril to 40 mg daily Diet and exercise Recheck in 1-2 weeks

## 2017-08-17 NOTE — Progress Notes (Signed)
Patient ID: John Cisneros, male    DOB: [birth year], 37 y.o.   MRN: [id number]  PCP: John Rossetti, MD  Chief Complaint  Patient presents with  . BS elevated    BS non fasting - this am - 509     Subjective:   John Cisneros is a 37 y.o. male, presents to clinic with CC of  Patient presents with random glucose at work today of 509.  He has been feeling very fatigued with increased thirst and on urinary frequency over the past week.  He did not feel well today so that is why they sent him to the nurse to take his blood sugar.  In clinic here his blood sugar is 314.  Patient has admittedly poor diet, he just recently started working out however I did see him roughly 1 week ago when he stated he had gone to the gym one time and then he hurt his calf muscle and last week he was on vacation.  His weight has been fairly stable, it is weight roughly 315 pounds, and over the past week he does not believe he is lost any weight.  He did say that he kind of "pigged out" and drink a lot of alcohol on his vacation. He has a history of hypertension, diagnosed 7 years ago, is currently on lisinopril 20 mg and Norvasc 10 mg.  Presents with elevated blood pressure today, and 50/100, states he did take his medicine this morning, he denies any headaches, chest pain, shortness of breath, palpitations, swelling in extremities, vision changes. Family history pertinent for hypertension and heart disease    Patient Active Problem List   Diagnosis Date Noted  . Type 2 diabetes mellitus with hyperglycemia, without long-term current use of insulin ([place]) 08/17/2017  . Fatigue 01/28/2015  . Ingrown toenail 01/08/2014  . Pityriasis rosea 01/19/2013  . 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 Seasonal allergies 05/22/2012  . Kidney stone 03/01/2011  . Microscopic hematuria 03/01/2011  . Obesity 03/15/2006  . Essential hypertension 03/15/2006     Prior to Admission medications   Medication Sig Start Date End Date  Taking? Authorizing Provider  amLODipine (NORVASC) 10 MG tablet Take 1 tablet (10 mg total) by mouth daily. 08/03/17  Yes [place], Modena Nunnery, MD  CINNAMON PO Take daily by mouth.   Yes [provider]  lisinopril (PRINIVIL,ZESTRIL) 20 MG tablet Take 1 tablet (20 mg total) by mouth daily. 08/03/17  Yes [place], Modena Nunnery, MD  naproxen (NAPROSYN) 500 MG tablet Take 1 tablet (500 mg total) by mouth 2 (two) times daily with a meal. 08/11/17  Yes Delsa Grana, PA-C  Omega-3 Fatty Acids (FISH OIL) 1000 MG CAPS Take daily by mouth.   Yes [provider]     No Known Allergies   Family History  Problem Relation Age of Onset  . Hypertension Father   . Heart disease Maternal Grandmother   . Heart disease Maternal Grandfather   . Heart disease Paternal Grandmother   . Heart disease Paternal Grandfather      Social History   Socioeconomic History  . Marital status: Married    Spouse name: Not on file  . Number of children: Not on file  . Years of education: Not on file  . Highest education level: Not on file  Occupational History  . Not on file  Social Needs  . Financial resource strain: Not on file  . Food insecurity:    Worry: Not on file  Inability: Not on file  . Transportation needs:    Medical: No    Non-medical: No  Tobacco Use  . Smoking status: Former Smoker    Packs/day: 0.50    Types: Cigarettes  . Smokeless tobacco: Never Used  Substance and Sexual Activity  . Alcohol use: Yes    Alcohol/week: 0.0 oz    Comment: occasional  . Drug use: No  . Sexual activity: Not on file  Lifestyle  . Physical activity:    Days per week: 0 days    Minutes per session: 0 min  . Stress: Not on file  Relationships  . Social connections:    Talks on phone: Not on file    Gets together: Not on file    Attends religious service: Not on file    Active member of club or organization: Not on file    Attends meetings of clubs or organizations: Not on file     Relationship status: Married  . Intimate partner violence:    Fear of current or ex partner: Not on file    Emotionally abused: Not on file    Physically abused: Not on file    Forced sexual activity: Not on file  Other Topics Concern  . Not on file  Social History Narrative  . Not on file     Review of Systems  Constitutional: Positive for fatigue. Negative for activity change, appetite change, chills, diaphoresis, fever and unexpected weight change.  HENT: Negative.   Eyes: Negative.  Negative for visual disturbance.  Respiratory: Negative.  Negative for chest tightness and shortness of breath.   Cardiovascular: Negative.  Negative for chest pain, palpitations and leg swelling.  Gastrointestinal: Negative.   Endocrine: Positive for polydipsia and polyuria. Negative for cold intolerance and heat intolerance.  Genitourinary: Positive for frequency. Negative for decreased urine volume, difficulty urinating, dysuria, enuresis, flank pain, hematuria and urgency.  Musculoskeletal: Negative.   Skin: Negative.  Negative for color change, pallor and rash.  Allergic/Immunologic: Negative.   Neurological: Negative for dizziness, syncope, facial asymmetry, weakness, light-headedness, numbness and headaches.  Hematological: Negative.   Psychiatric/Behavioral: Negative.   All other systems reviewed and are negative.      Objective:    Vitals:   08/17/17 1512  BP: (!) 150/100  Pulse: 95  Resp: 18  Temp: 98.6 F (37 C)  TempSrc: Oral  SpO2: 98%  Weight: (!) 317 lb (143.8 kg)  Height: 6' (1.829 m)      Physical Exam  Constitutional: He is oriented to person, place, and time. He appears well-developed and well-nourished.  Non-toxic appearance. He does not appear ill. No distress.  HENT:  Head: Normocephalic and atraumatic.  Right Ear: Tympanic membrane, external ear and ear canal normal.  Left Ear: Tympanic membrane, external ear and ear canal normal.  Nose: Nose normal. No  mucosal edema or rhinorrhea. Right sinus exhibits no maxillary sinus tenderness and no frontal sinus tenderness. Left sinus exhibits no maxillary sinus tenderness and no frontal sinus tenderness.  Mouth/Throat: Uvula is midline and oropharynx is clear and moist. No trismus in the jaw. No uvula swelling. No oropharyngeal exudate, posterior oropharyngeal edema or posterior oropharyngeal erythema.  MMM  Eyes: Pupils are equal, round, and reactive to light. Conjunctivae, EOM and lids are normal.  Neck: Trachea normal, normal range of motion and phonation normal. Neck supple. No JVD present. No tracheal deviation present.  Cardiovascular: Normal rate, regular rhythm, normal heart sounds, intact distal pulses and normal pulses. Exam  reveals no gallop and no friction rub.  No murmur heard. Pulses:      Radial pulses are 2+ on the right side, and 2+ on the left side.       Posterior tibial pulses are 2+ on the right side, and 2+ on the left side.  No LE edema  Pulmonary/Chest: Effort normal and breath sounds normal. No stridor. No respiratory distress. He has no wheezes. He has no rhonchi. He has no rales. He exhibits no tenderness.  Abdominal: Soft. Normal appearance and bowel sounds are normal. He exhibits no distension and no mass. There is no tenderness. There is no rebound and no guarding.  Musculoskeletal: Normal range of motion. He exhibits no edema.  Neurological: He is alert and oriented to person, place, and time. No cranial nerve deficit or sensory deficit. He exhibits normal muscle tone. Coordination and gait normal.  Skin: Skin is warm, dry and intact. Capillary refill takes less than 2 seconds. No rash noted. He is not diaphoretic. No erythema. No pallor.  Psychiatric: He has a normal mood and affect. His speech is normal and behavior is normal. Thought content normal.  Nursing note and vitals reviewed.    Results for orders placed or performed in visit on 08/17/17 (from the past 24  hour(s))  GLUCOSE [id number]     Status: Abnormal   Collection Time: 08/17/17  3:28 PM  Result Value Ref Range   Glucose 314 (H) 65 - 99 mg/dL  Urinalysis, Routine w reflex microscopic     Status: Abnormal   Collection Time: 08/17/17  3:29 PM  Result Value Ref Range   Color, Urine YELLOW YELLOW   APPearance CLEAR CLEAR   Specific Gravity, Urine 1.025 1.001 - 1.03   pH 6.0 5.0 - 8.0   Glucose, UA 2+ (A) NEGATIVE   Bilirubin Urine NEGATIVE NEGATIVE   Ketones, ur NEGATIVE NEGATIVE   Hgb urine dipstick 2+ (A) NEGATIVE   Protein, ur 2+ (A) NEGATIVE   Nitrite NEGATIVE NEGATIVE   Leukocytes, UA NEGATIVE NEGATIVE   WBC, UA NONE SEEN 0 - 5 /HPF   RBC / HPF 3-10 (A) 0 - 2 /HPF   Squamous Epithelial / LPF NONE SEEN < OR = 5 /HPF   Bacteria, UA NONE SEEN NONE SEEN /HPF  Microscopic Message     Status: None   Collection Time: 08/17/17  3:29 PM  Result Value Ref Range   Note    Hemoglobin A1C, fingerstick     Status: Abnormal   Collection Time: 08/17/17  4:58 PM  Result Value Ref Range   Hgb A1C (fingerstick) 7.5 (H) <6.0 % OF TOTAL HGB       Assessment & Plan:      Problem List Items Addressed This Visit      Cardiovascular and Mediastinum   Essential hypertension    BP elevated today 08/17/17, 150/100, with new onset T2DM Will increase lisinopril to 40 mg daily Diet and exercise Recheck in 1-2 weeks      Relevant Medications   lisinopril (PRINIVIL,ZESTRIL) 40 MG tablet     Endocrine   Type 2 diabetes mellitus with hyperglycemia, without long-term current use of insulin (HCC) - Primary    Metformin + lifestyle changes, diet and exercise Glucometer, strips and lancets ordered Sugar log reviewed  Pt will need statin, possibly ASA? Has risk factors of obesity, former smoker, HTN, fx of CAD, hx of proteinuria/hematuria  CMP, CBC, A1C, lipids, urine albumin F/up in 1-2 weeks for  f/up appt with Dr. Buelah Manis, will need to increase metformin as tolerated      Relevant Medications     metFORMIN (GLUCOPHAGE) 500 MG tablet   Glucose Blood (BLOOD GLUCOSE TEST STRIPS) STRP   Blood Glucose Monitoring Suppl (BLOOD GLUCOSE SYSTEM PAK) KIT   Lancets MISC   lisinopril (PRINIVIL,ZESTRIL) 40 MG tablet   Other Relevant Orders   Urinalysis, Routine w reflex microscopic (Completed)   Glucose, fingerstick (stat)   GLUCOSE [id number] (Completed)   Microscopic Message (Completed)   COMPLETE METABOLIC PANEL WITH GFR   CBC with Differential   Hemoglobin A1C, fingerstick (Completed)   Microalbumin / creatinine urine ratio   Lipid Panel    Other Visit Diagnoses    Hyperglycemia       Relevant Orders   GLUCOSE [id number] (Completed)   Microscopic Message (Completed)   COMPLETE METABOLIC PANEL WITH GFR   CBC with Differential   Hemoglobin A1C, fingerstick (Completed)   Microalbumin / creatinine urine ratio   Lipid Panel      New onset diabetes with random blood sugars in clinic of 314, states it was as high as 509 today.  In-house hemoglobin A1c was 7.5, which does not correlate to history of sugars as high as they are currently today.  Does state he has binged over the last week.  He was sx of catabolism with fatigue, polyuria, polydypsia and nocturia.   Will start metformin, glucometer ordered, reviewed how to take blood sugar, how to keep sugar log.  Discussed taking Amaryl once daily with meals only when blood sugar is preprandial over 180 Discussed patient signs and symptoms would be concerning for hypoglycemia, discussed treatment if he encounters hypoglycemia.  This was also reviewed with his wife present who verbalizes understanding, all questions asked and answered. Also discussed at length with patient and with his wife how he will be needing tighter blood pressure control, is currently elevated despite taking medications today.  Discussed how he would need to take a statin.   Screening labs obtained for new diagnosis of diabetes including CMP, CBC, microalbumin, urinalysis,  lipid panel he will need to return fasting to do.   Discussed with patient need to be on a statin  I spent approximately 45 minutes with the patient today. Over 50% of this time was spent with counseling and educating the patient.   Delsa Grana, PA-C 08/17/17 6:55 PM

## 2017-08-17 NOTE — Telephone Encounter (Signed)
Received call from patient.   Reports that he checked his FSBS at work and noted it to be around 400. States that he had eaten a large carbohydrate rich breakfast around 1 hour prior. Reports that he did drink bottle of water and noted FSBS came down approximately 30 points. Endorses polydipsia. States that he can not come in for appointment at this time.   Advised that FSBS can be highly elevated after carbohydrate rich meal. Advised to continue to push fluids and repeat CBG in 1 hour. Will call to notify of results.

## 2017-08-17 NOTE — Telephone Encounter (Signed)
Agree, thank you

## 2017-08-18 LAB — MICROALBUMIN / CREATININE URINE RATIO
Creatinine, Urine: 150 mg/dL (ref 20–320)
MICROALB UR: 46.3 mg/dL
Microalb Creat Ratio: 309 mcg/mg creat — ABNORMAL HIGH (ref <30)

## 2017-08-18 NOTE — Progress Notes (Signed)
Discussed lab work with patient today, microalbumin creatinine ratio elevated, will repeat check for clearance while we treat hypertension, increase ACE inhibitor, and control blood sugars Sodium and chloride within normal limits when adjusted for hyperglycemia Mildly elevated white count which is pretty consistent with his history over the past several years he tends to carry a white count 10-13,000, no focal concern for infection to likely secondary to some stress or inflammation  Patient still has pending fasting lipids which he was instructed doing the next 1 to 2 weeks.  We will plan for initiating treatment of new onset type 2 diabetes.  He states his fasting sugars this morning were 180.  He will take metformin 500 mg twice daily for 2 weeks, keep a log of his sugars as discussed at length yesterday, return with his sugar log, and we will need to do foot exam, discuss eye exam and add on statin at that time.  Patient has mentioned some intermittent blurry vision so he was advised to go get a eye exam or come to clinic for some screening we did not discuss this at length yesterday.  Also advised patient if with any chest pain shortness of breath blurry vision with tingling numbness near syncope or severe headaches he needs go to ER for emergent evaluation.  He verbalized understanding of plan and already has an appointment to be rechecked in clinic in 2 weeks

## 2017-08-31 ENCOUNTER — Other Ambulatory Visit: Payer: Self-pay

## 2017-08-31 ENCOUNTER — Ambulatory Visit: Payer: BLUE CROSS/BLUE SHIELD | Admitting: Family Medicine

## 2017-08-31 ENCOUNTER — Encounter: Payer: Self-pay | Admitting: *Deleted

## 2017-08-31 ENCOUNTER — Ambulatory Visit (INDEPENDENT_AMBULATORY_CARE_PROVIDER_SITE_OTHER): Payer: BLUE CROSS/BLUE SHIELD | Admitting: Family Medicine

## 2017-08-31 ENCOUNTER — Telehealth: Payer: Self-pay | Admitting: *Deleted

## 2017-08-31 ENCOUNTER — Encounter: Payer: Self-pay | Admitting: Family Medicine

## 2017-08-31 VITALS — BP 140/88 | HR 68 | Temp 98.3°F | Resp 12 | Ht 72.0 in | Wt 307.0 lb

## 2017-08-31 DIAGNOSIS — I10 Essential (primary) hypertension: Secondary | ICD-10-CM | POA: Diagnosis not present

## 2017-08-31 DIAGNOSIS — Z6841 Body Mass Index (BMI) 40.0 and over, adult: Secondary | ICD-10-CM | POA: Diagnosis not present

## 2017-08-31 DIAGNOSIS — E1165 Type 2 diabetes mellitus with hyperglycemia: Secondary | ICD-10-CM | POA: Diagnosis not present

## 2017-08-31 LAB — LIPID PANEL
Cholesterol: 105 mg/dL (ref <200)
HDL: 32 mg/dL — ABNORMAL LOW (ref >40)
LDL CHOLESTEROL (CALC): 51 mg/dL
NON-HDL CHOLESTEROL (CALC): 73 mg/dL (ref <130)
TRIGLYCERIDES: 140 mg/dL (ref <150)
Total CHOL/HDL Ratio: 3.3 (calc) (ref <5.0)

## 2017-08-31 LAB — BASIC METABOLIC PANEL
BUN: 14 mg/dL (ref 7–25)
CALCIUM: 9.5 mg/dL (ref 8.6–10.3)
CO2: 24 mmol/L (ref 20–32)
Chloride: 105 mmol/L (ref 98–110)
Creat: 1.24 mg/dL (ref 0.60–1.35)
Glucose, Bld: 104 mg/dL — ABNORMAL HIGH (ref 65–99)
Potassium: 4.4 mmol/L (ref 3.5–5.3)
Sodium: 137 mmol/L (ref 135–146)

## 2017-08-31 LAB — EXTRA LAV TOP TUBE

## 2017-08-31 MED ORDER — AMLODIPINE BESYLATE 10 MG PO TABS: 10.0000 mg | ORAL_TABLET | Freq: Every day | ORAL | 2 refills | Status: DC

## 2017-08-31 MED ORDER — LISINOPRIL 40 MG PO TABS: 40.0000 mg | ORAL_TABLET | Freq: Every day | ORAL | 3 refills | Status: DC

## 2017-08-31 MED ORDER — METFORMIN HCL 500 MG PO TABS: 500.0000 mg | ORAL_TABLET | Freq: Two times a day (BID) | ORAL | 1 refills | Status: DC

## 2017-08-31 NOTE — Progress Notes (Signed)
Subjective:    Patient ID: John Cisneros, male    DOB: [birth year], 37 y.o.   MRN: [id number]  Patient presents for Follow-up (is fasting- needs lipid panel) Pt here for F/U new onset diabetes diagnosed 2 weeks ago.  He was started on metformin his A1c was 7.5%.  Renal function was stable urine micro looks good.  He did not have his blood pressure medications last week was elevated but states that working his check is been in the 120s.  Today he is also not taking his medications.  He is tolerating the metformin as prescribed initially had some diarrhea that has improved.  Due to his readings  CBG Fasting CBG since being on meds 101-145 , after meals 94-162  HTN- has not taken any meds    Review Of Systems:  GEN- denies fatigue, fever, weight loss,weakness, recent illness HEENT- denies eye drainage, change in vision, nasal discharge, CVS- denies chest pain, palpitations RESP- denies SOB, cough, wheeze ABD- denies N/V, change in stools, abd pain GU- denies dysuria, hematuria, dribbling, incontinence MSK- denies joint pain, muscle aches, injury Neuro- denies headache, dizziness, syncope, seizure activity       Objective:    BP 140/88   Pulse 68   Temp 98.3 F (36.8 C) (Oral)   Resp 12   Ht 6' (1.829 m)   Wt (!) 307 lb (139.3 kg)   SpO2 95%   BMI 41.64 kg/m  GEN- NAD, alert and oriented x3 HEENT- PERRL, EOMI, non injected sclera, pink conjunctiva, MMM, oropharynx clear CVS- RRR, no murmur RESP-CTAB EXT- No edema Pulses- Radial, DP- 2+        Assessment & Plan:      Problem List Items Addressed This Visit      Unprioritized   Obesity   Relevant Medications   metFORMIN (GLUCOPHAGE) 500 MG tablet   Type 2 diabetes mellitus with hyperglycemia, without long-term current use of insulin (HCC) - Primary    Gust in detail with diabetes mellitus how his medication is working.  Also gave him handouts on foods as well as portion control.  He is to continue checking his  blood sugars twice a day.  He will call if he starts getting hypoglycemia symptoms as he is working diligently on his work.  Has intentionally lost 10 pounds in the past couple weeks.  He is cut out junk food carbs fast food and soda. Check a lipid panel to get a baseline today but likely will not start statin drug and less extremely high LDL.  Try to obtain his last eye visit which she had a few weeks ago.  He wears contacts      Relevant Medications   lisinopril (PRINIVIL,ZESTRIL) 40 MG tablet   metFORMIN (GLUCOPHAGE) 500 MG tablet   Other Relevant Orders   Basic metabolic panel   Lipid panel   HM DIABETES FOOT EXAM (Completed)   Essential hypertension    Blood pressure improved from last week however he does not have his medications.  Advised that he should continue to check on his blood pressures we will continue the amlodipine and the lisinopril.      Relevant Medications   amLODipine (NORVASC) 10 MG tablet   lisinopril (PRINIVIL,ZESTRIL) 40 MG tablet   Other Relevant Orders   Basic metabolic panel      Note: This dictation was prepared with Dragon dictation along with smaller phrase technology. Any transcriptional errors that result from this process are unintentional.

## 2017-08-31 NOTE — Telephone Encounter (Signed)
Okay to write letter 

## 2017-08-31 NOTE — Patient Instructions (Addendum)
Continue current medications F/U 2 months for diabetes Release of records- [facility]

## 2017-08-31 NOTE — Telephone Encounter (Signed)
Letter transcribed and faxed to [facility].

## 2017-08-31 NOTE — Telephone Encounter (Signed)
Received call from patient.   Reports that he requires letter from PCP stating that he needs to use closest bathroom while at work d/t medication. 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   Requested letter to be faxed to [employer], Attn: Lucy Crosslin, HR [phone]~fax.

## 2017-08-31 NOTE — Assessment & Plan Note (Addendum)
Gust in detail with diabetes mellitus how his medication is working.  Also gave him handouts on foods as well as portion control.  He is to continue checking his blood sugars twice a day.  He will call if he starts getting hypoglycemia symptoms as he is working diligently on his work.  Has intentionally lost 10 pounds in the past couple weeks.  He is cut out junk food carbs fast food and soda. Check a lipid panel to get a baseline today but likely will not start statin drug and less extremely high LDL.  Try to obtain his last eye visit which she had a few weeks ago.  He wears contacts

## 2017-08-31 NOTE — Assessment & Plan Note (Signed)
Blood pressure improved from last week however he does not have his medications.  Advised that he should continue to check on his blood pressures we will continue the amlodipine and the lisinopril.

## 2017-09-06 ENCOUNTER — Encounter: Payer: Self-pay | Admitting: *Deleted

## 2017-11-14 ENCOUNTER — Encounter: Payer: Self-pay | Admitting: Family Medicine

## 2017-11-14 ENCOUNTER — Ambulatory Visit (INDEPENDENT_AMBULATORY_CARE_PROVIDER_SITE_OTHER): Payer: BLUE CROSS/BLUE SHIELD | Admitting: Family Medicine

## 2017-11-14 ENCOUNTER — Other Ambulatory Visit: Payer: Self-pay

## 2017-11-14 VITALS — BP 138/82 | HR 72 | Temp 98.2°F | Resp 14 | Ht 72.0 in | Wt 296.0 lb

## 2017-11-14 DIAGNOSIS — I10 Essential (primary) hypertension: Secondary | ICD-10-CM | POA: Diagnosis not present

## 2017-11-14 DIAGNOSIS — Z23 Encounter for immunization: Secondary | ICD-10-CM

## 2017-11-14 DIAGNOSIS — E1165 Type 2 diabetes mellitus with hyperglycemia: Secondary | ICD-10-CM | POA: Diagnosis not present

## 2017-11-14 DIAGNOSIS — Z6841 Body Mass Index (BMI) 40.0 and over, adult: Secondary | ICD-10-CM

## 2017-11-14 NOTE — Patient Instructions (Signed)
Pneumonia shot given Check fasting blood sugars and keep recording Hold off on metformin  Give work note for today  F/U 4 months

## 2017-11-14 NOTE — Progress Notes (Signed)
   Subjective:    Patient ID: John Cisneros, male    DOB: [birth year], 37 y.o.   MRN: [id number]  Patient presents for Follow-up (is fasting) Here for interim follow-up.  He has lost 11 pounds since his visit in May.  Was diagnosed with diabetes mellitus at the end of April.  He is was prescribed MTF but he stopped taking 1 month ago, to see how his sugars would do  his 30 day average is 109  Fasting range 82-122  Evening 88-145 No hypoglycemia symptoms Exercises most days after work    Hypertension he is taking amlodipine and lisinopril  His lipid panel was normal besides low 8 HDL at 32  Review Of Systems:  GEN- denies fatigue, fever, weight loss,weakness, recent illness HEENT- denies eye drainage, change in vision, nasal discharge, CVS- denies chest pain, palpitations RESP- denies SOB, cough, wheeze ABD- denies N/V, change in stools, abd pain GU- denies dysuria, hematuria, dribbling, incontinence MSK- denies joint pain, muscle aches, injury Neuro- denies headache, dizziness, syncope, seizure activity       Objective:    BP 138/82   Pulse 72   Temp 98.2 F (36.8 C) (Oral)   Resp 14   Ht 6' (1.829 m)   Wt 296 lb (134.3 kg)   SpO2 97%   BMI 40.14 kg/m  GEN- NAD, alert and oriented x3 HEENT- PERRL, EOMI, non injected sclera, pink conjunctiva, MMM, oropharynx clear Neck- Supple, no thyromegaly CVS- RRR, no murmur RESP-CTAB ABD-NABS,soft,NT,ND EXT- No edema Pulses- Radial, DP- 2+        Assessment & Plan:      Problem List Items Addressed This Visit      Unprioritized   Essential hypertension    BP continues to improve, he did not have meds this AM No changes, expect with continued weight loss, will be able to decrease his bP meds as well      Relevant Orders   Comprehensive metabolic panel   Obesity   Type 2 diabetes mellitus with hyperglycemia, without long-term current use of insulin (HCC) - Primary    Blood sugars much improved even without  MTF Continue with ongoing weight loss, goal to lose another 40-50lbs Recheck A1C today and renal function No statin needed at this time pneumonia vaccine given       Relevant Orders   Comprehensive metabolic panel   Hemoglobin A1c      Note: This dictation was prepared with Dragon dictation along with smaller phrase technology. Any transcriptional errors that result from this process are unintentional.

## 2017-11-14 NOTE — Assessment & Plan Note (Signed)
Blood sugars much improved even without MTF Continue with ongoing weight loss, goal to lose another 40-50lbs Recheck A1C today and renal function No statin needed at this time pneumonia vaccine given

## 2017-11-14 NOTE — Addendum Note (Signed)
Addended by: Phillips OdorSIX, CHRISTINA H on: 11/14/2017 09:36 AM   Modules accepted: Orders

## 2017-11-14 NOTE — Assessment & Plan Note (Signed)
BP continues to improve, he did not have meds this AM No changes, expect with continued weight loss, will be able to decrease his bP meds as well

## 2017-11-15 LAB — COMPREHENSIVE METABOLIC PANEL
AG RATIO: 1.6 (calc) (ref 1.0–2.5)
ALKALINE PHOSPHATASE (APISO): 63 U/L (ref 40–115)
ALT: 25 U/L (ref 9–46)
AST: 19 U/L (ref 10–40)
Albumin: 4.3 g/dL (ref 3.6–5.1)
BILIRUBIN TOTAL: 0.5 mg/dL (ref 0.2–1.2)
BUN: 16 mg/dL (ref 7–25)
CO2: 28 mmol/L (ref 20–32)
Calcium: 9.5 mg/dL (ref 8.6–10.3)
Chloride: 103 mmol/L (ref 98–110)
Creat: 1.23 mg/dL (ref 0.60–1.35)
GLOBULIN: 2.7 g/dL (ref 1.9–3.7)
Glucose, Bld: 101 mg/dL — ABNORMAL HIGH (ref 65–99)
Potassium: 4.7 mmol/L (ref 3.5–5.3)
Sodium: 138 mmol/L (ref 135–146)
Total Protein: 7 g/dL (ref 6.1–8.1)

## 2017-11-15 LAB — HEMOGLOBIN A1C
EAG (MMOL/L): 5 (calc)
HEMOGLOBIN A1C: 4.8 %{Hb} (ref <5.7)
Mean Plasma Glucose: 91 (calc)

## 2017-12-16 ENCOUNTER — Telehealth: Payer: Self-pay | Admitting: Family Medicine

## 2017-12-16 MED ORDER — METHYLPREDNISOLONE 4 MG PO TBPK: ORAL_TABLET | ORAL | 0 refills | Status: DC

## 2017-12-16 NOTE — Telephone Encounter (Signed)
Spoke with patient and informed him of recommendations per Dr. Jeanice Limurham. Patient verbalized understanding. Scheduled for an office visit on 12/21/17.

## 2017-12-16 NOTE — Telephone Encounter (Signed)
Patient called in with c/o lip swelling. States he did not eat or drink anything different feels it could be coming from Lisinopril. He denies any other facial swelling, or any SOB. Denies rash. Onset yesterday. Please advise?

## 2017-12-16 NOTE — Telephone Encounter (Signed)
Stop the Lisinopril this may be angioedema As long as no difficulty breathing or swallowing Take benadryl Give Medrol dose Come in Next week for blood pressure, to get a new medication  Continue norvasc

## 2017-12-21 ENCOUNTER — Ambulatory Visit: Payer: Self-pay | Admitting: Family Medicine

## 2017-12-22 ENCOUNTER — Ambulatory Visit (INDEPENDENT_AMBULATORY_CARE_PROVIDER_SITE_OTHER): Payer: BLUE CROSS/BLUE SHIELD | Admitting: Physician Assistant

## 2017-12-22 ENCOUNTER — Encounter: Payer: Self-pay | Admitting: Physician Assistant

## 2017-12-22 VITALS — BP 144/102 | HR 79 | Temp 98.2°F | Resp 16 | Ht 72.0 in | Wt 293.4 lb

## 2017-12-22 DIAGNOSIS — I10 Essential (primary) hypertension: Secondary | ICD-10-CM

## 2017-12-22 DIAGNOSIS — E1165 Type 2 diabetes mellitus with hyperglycemia: Secondary | ICD-10-CM

## 2017-12-22 MED ORDER — LOSARTAN POTASSIUM 50 MG PO TABS: 50.0000 mg | ORAL_TABLET | Freq: Every day | ORAL | 0 refills | Status: DC

## 2017-12-22 NOTE — Progress Notes (Signed)
Patient ID: John Cisneros MRN: [id number], DOB: 1980/09/26, 37 y.o. Date of Encounter: 12/22/2017, 3:48 PM    Chief Complaint:  Chief Complaint  Patient presents with  . Follow-up  . Hypertension     HPI: 37 y.o. year old male presents with above.   I reviewed Dr. Dorian Heckle office note from 11/14/2017. He was diagnosed with diabetes at the end of April.  Was prescribed metformin but stopped taking as he was making significant diet and exercise changes and having weight loss with this.  He has also been on amlodipine and lisinopril for hypertension. Was on lisinopril 40 mg and amlodipine 10 mg.  He recently called the office on 12/16/2017 reporting lip swelling.  He reported that he had not eaten or drank anything different and felt that the symptoms may be coming from lisinopril.  Has not any other facial swelling or shortness of breath or rash.  At that time he was instructed to stop the lisinopril as this was felt that it could be angioedema.  He was to take Benadryl and  Dosepak.  He was to come in for visit to discuss new blood pressure medication.   Was to continue Norvasc.  He presents for that follow-up visit today. 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  He reports that he did take the Benadryl and Dosepak as directed.  States that the lip swelling resolved with that treatment.  Has had no recurrent swelling and has had no additional symptoms in that regard.  He has no other concerns to address today.  He has continued to take the Norvasc 10 mg daily. He does note that in the past he had been on other blood pressure medications that caused erectile dysfunction and that he wants to avoid medicines that may cause ED.     Home Meds:   Outpatient Medications Prior to Visit  Medication Sig Dispense Refill  . amLODipine (NORVASC) 10 MG tablet Take 1 tablet (10 mg total) by mouth daily. 90 tablet 2  . Blood Glucose Monitoring Suppl (BLOOD GLUCOSE  SYSTEM PAK) KIT Please dispense based on patient and insurance preference. Use as directed to monitor FSBS up to 3x daily. Dx: E11.65. 1 each 1  . Glucose Blood (BLOOD GLUCOSE TEST STRIPS) STRP Please dispense based on patient and insurance preference. Use as directed to monitor FSBS up to 3x daily. Dx: E11.65. 100 each 1  . Lancets MISC Please dispense based on patient and insurance preference. Use as directed to monitor FSBS up to 3x daily. Dx: E11.65. 100 each 1  . metFORMIN (GLUCOPHAGE) 500 MG tablet Take 1 tablet (500 mg total) by mouth 2 (two) times daily with a meal. 180 tablet 1  . methylPREDNISolone (MEDROL DOSEPAK) 4 MG TBPK tablet Take as directed 21 tablet 0  . lisinopril (PRINIVIL,ZESTRIL) 40 MG tablet Take 1 tablet (40 mg total) by mouth daily. (Patient not taking: Reported on 12/22/2017) 90 tablet 3   No facility-administered medications prior to visit.     Allergies:  Allergies  Allergen Reactions  . Lisinopril Swelling    Lip swelling. angioedema      Review of Systems: See HPI for pertinent ROS. All other ROS negative.    Physical Exam: Blood pressure (!) 144/102, pulse 79, temperature 98.2 F (36.8 C), temperature source Oral, resp. rate 16, height 6' (1.829 m), weight 133.1 kg, SpO2 99 %., Body mass index is 39.79 kg/m. General: AAM.  Appears in no acute  distress.  Neck: Supple. No thyromegaly. No lymphadenopathy. Lungs: Clear bilaterally to auscultation without wheezes, rales, or rhonchi. Breathing is unlabored. Heart: Regular rhythm. No murmurs, rubs, or gallops. Msk:  Strength and tone normal for age. Extremities/Skin: Warm and dry.  No edema.  Neuro: Alert and oriented X 3. Moves all extremities spontaneously. Gait is normal. CNII-XII grossly in tact. Psych:  Responds to questions appropriately with a normal affect.     ASSESSMENT AND PLAN:  37 y.o. year old male with   1. Essential hypertension He does note that in the past he had been on other blood  pressure medications that caused erectile dysfunction and that he wants to avoid medicines that may cause ED. ------Will avoid beta-blockers and diuretics. He is already on calcium blocker. Did review up to date regarding if he has had angioedema with ACE inhibitor the chance of angioedema with ARB. However up-to-date states that---- prior ACE induced angioedema---- incidence of angioedema was significantly lower in patients treated with ARB than those treated with other classes of blood pressure agents. Up-to-date also states that the incidence of angioedema with ARB use was not significantly different than placebo. Given his concern of erectile dysfunction--- and also that he has concomitant diabetes ---ARB is a good choice. Continue Norvasc 10 mg daily.  He is to add losartan 50 mg daily.  He is return for follow-up in 2 weeks to recheck BP and be met on this medication. - losartan (COZAAR) 50 MG tablet; Take 1 tablet (50 mg total) by mouth daily.  Dispense: 30 tablet; Refill: 0  2. Type 2 diabetes mellitus with hyperglycemia, without long-term current use of insulin (HCC) he has concomitant diabetes ---ARB is a good choice.  - losartan (COZAAR) 50 MG tablet; Take 1 tablet (50 mg total) by mouth daily.  Dispense: 30 tablet; Refill: 0   Signed, [address] [place], [place], [facility] 12/22/2017 3:48 PM

## 2018-01-09 ENCOUNTER — Telehealth: Payer: Self-pay | Admitting: *Deleted

## 2018-01-09 MED ORDER — NEBIVOLOL HCL 10 MG PO TABS: 10.0000 mg | ORAL_TABLET | Freq: Every day | ORAL | 1 refills | Status: DC

## 2018-01-09 NOTE — Telephone Encounter (Signed)
Received call from patient.   Reports that he has noted increased instances of ED with Losartan.   Requested to have  Medication changed.   Please advise.

## 2018-01-09 NOTE — Telephone Encounter (Signed)
He had recent OV with me to follow-up after having angioedema with ACE inhibitor.   Therefore the ACE inhibitor had been discontinued  and he was continued on Norvasc and  scheduled follow-up OV with me. At his OV, he mentioned that he wanted to make sure I did not give him any medicine that may "messed with his nature ". Therefore, I specifically state away from HCTZ and beta-blockers that are more common to cause ED. He can not take ACE inhibitor because of angioedema He is already on Norvasc 10 mg Not a lot of other BP medication options.  Can try bystolic.  Would have to see what the cost is with his insurance. Otherwise he may just have to use some Viagra continue current BP meds. Send Rx for Bystolic 10mg  1 po Qd # 30 + 1. If he does change to this medication, then have him come in for follow-up visit in 2 to 3 weeks.

## 2018-01-09 NOTE — Telephone Encounter (Signed)
Call placed to patient and patient made aware.   Patient would like to try Bystolic first. Prescription sent to pharmacy.

## 2018-02-08 ENCOUNTER — Other Ambulatory Visit: Payer: Self-pay | Admitting: *Deleted

## 2018-02-08 MED ORDER — NEBIVOLOL HCL 10 MG PO TABS: 10.0000 mg | ORAL_TABLET | Freq: Every day | ORAL | 1 refills | Status: DC

## 2018-06-28 ENCOUNTER — Other Ambulatory Visit: Payer: Self-pay | Admitting: Family Medicine

## 2018-10-10 ENCOUNTER — Telehealth: Payer: Self-pay | Admitting: *Deleted

## 2018-10-10 NOTE — Telephone Encounter (Signed)
Received call from patient [phone]~ telephone.   Reports that there have been multiple cases of COVID + persons at [facility].   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 His requested return to work date is 10/24/2018. States that his job requires documentation to approve time off. States that he does not require FMLA for request.   Requested letter to be faxed to [place], Attn: Lucy Crosslin, HR [phone]~fax.

## 2018-10-10 NOTE — Telephone Encounter (Signed)
Leave of absence date 10/09/2018- 10/24/2018.

## 2018-10-10 NOTE — Telephone Encounter (Signed)
Letter written

## 2018-11-07 ENCOUNTER — Ambulatory Visit: Payer: Self-pay | Admitting: Family Medicine

## 2019-02-23 ENCOUNTER — Other Ambulatory Visit: Payer: Self-pay

## 2019-02-23 MED ORDER — AMLODIPINE BESYLATE 10 MG PO TABS: ORAL_TABLET | ORAL | 0 refills | Status: DC

## 2019-03-01 ENCOUNTER — Other Ambulatory Visit: Payer: Self-pay | Admitting: *Deleted

## 2019-03-01 MED ORDER — AMLODIPINE BESYLATE 10 MG PO TABS: ORAL_TABLET | ORAL | 0 refills | Status: DC

## 2019-03-05 ENCOUNTER — Ambulatory Visit: Payer: BC Managed Care – PPO | Admitting: Family Medicine

## 2019-03-07 ENCOUNTER — Other Ambulatory Visit: Payer: Self-pay | Admitting: Family Medicine

## 2019-03-07 MED ORDER — NEBIVOLOL HCL 10 MG PO TABS: 10.0000 mg | ORAL_TABLET | Freq: Every day | ORAL | 0 refills | Status: DC

## 2019-03-07 NOTE — Telephone Encounter (Signed)
Patient needs refill on bystolic  [facility] [address]

## 2019-03-07 NOTE — Telephone Encounter (Signed)
Spoke with patient and patient informed me he is scheduled for an appointment on 11/18. 30 day supply of medication sent to pharmacy.

## 2019-03-07 NOTE — Telephone Encounter (Signed)
Left message return call. Patient needs an office visit

## 2019-03-08 ENCOUNTER — Telehealth: Payer: Self-pay | Admitting: Family Medicine

## 2019-03-08 NOTE — Telephone Encounter (Signed)
Samples of Bystolic 10 mg were given to the patient, quantity 2 bottles, Lot Number [id number]. Exp 05/2020.

## 2019-03-14 ENCOUNTER — Ambulatory Visit (INDEPENDENT_AMBULATORY_CARE_PROVIDER_SITE_OTHER): Payer: BC Managed Care – PPO | Admitting: Family Medicine

## 2019-03-14 ENCOUNTER — Encounter: Payer: Self-pay | Admitting: Family Medicine

## 2019-03-14 VITALS — BP 152/98 | HR 74 | Temp 98.0°F | Resp 12 | Ht 72.0 in | Wt 302.0 lb

## 2019-03-14 DIAGNOSIS — Z6841 Body Mass Index (BMI) 40.0 and over, adult: Secondary | ICD-10-CM | POA: Diagnosis not present

## 2019-03-14 DIAGNOSIS — I10 Essential (primary) hypertension: Secondary | ICD-10-CM

## 2019-03-14 DIAGNOSIS — E1165 Type 2 diabetes mellitus with hyperglycemia: Secondary | ICD-10-CM | POA: Diagnosis not present

## 2019-03-14 MED ORDER — NEBIVOLOL HCL 20 MG PO TABS: 20.0000 mg | ORAL_TABLET | Freq: Every day | ORAL | 3 refills | Status: DC

## 2019-03-14 MED ORDER — AMLODIPINE BESYLATE 10 MG PO TABS: ORAL_TABLET | ORAL | 3 refills | Status: DC

## 2019-03-14 NOTE — Patient Instructions (Addendum)
Increase bystolic to 20mg  once a day  We will call with lab results F/U 6 weeks

## 2019-03-14 NOTE — Progress Notes (Signed)
   Subjective:    Patient ID: John Cisneros, male    DOB: [birth year], 38 y.o.   MRN: [id number]  Patient presents for HTN (is not fasting)   HTN- patient here to f/u blood pressre   He is on bystolic 10mg / norvasc 10mg  once a day   no swelling , he does not check his BP at home, feels fine  has gained weight, he has not been exercising but recently put elliptical in home, admits to eating out a lot past few months    DM- last A1C 1 year was  4.8, fasting have been in the  90's   no current meds    Review Of Systems:  GEN- denies fatigue, fever, weight loss,weakness, recent illness HEENT- denies eye drainage, change in vision, nasal discharge, CVS- denies chest pain, palpitations RESP- denies SOB, cough, wheeze ABD- denies N/V, change in stools, abd pain GU- denies dysuria, hematuria, dribbling, incontinence MSK- denies joint pain, muscle aches, injury Neuro- denies headache, dizziness, syncope, seizure activity       Objective:    BP (!) 152/98 (BP Location: Right Arm, Patient Position: Sitting, Cuff Size: Large)   Pulse 74   Temp 98 F (36.7 C) (Temporal)   Resp 12   Ht 6' (1.829 m)   Wt (!) 302 lb (137 kg)   SpO2 97%   BMI 40.96 kg/m  GEN- NAD, alert and oriented x3, obese HEENT- PERRL, EOMI, non injected sclera, pink conjunctiva, MMM, oropharynx clear Neck- Supple, no thyromegaly CVS- RRR, no murmur RESP-CTAB ABD-NABS,soft,NT,ND EXT- No edema Pulses- Radial, DP- 2+        Assessment & Plan:      Pt unable to leave urine sample, get urine micro next visit  Problem List Items Addressed This Visit      Unprioritized   Essential hypertension    Uncontrolled, increase bystolic to 20mg  once a day  Continue norvascc Recheck fasting labs, renal function Discussed dietary changes Increase water, cut back fried foods, salt intake to start with  Recheck BP in 6 weeks      Relevant Medications   Nebivolol HCl 20 MG TABS   amLODipine (NORVASC) 10 MG  tablet   Other Relevant Orders   Comprehensive metabolic panel (Completed)   CBC with Differential (Completed)   Lipid Panel (Completed)   Obesity   Type 2 diabetes mellitus with hyperglycemia, without long-term current use of insulin (HCC) - Primary    Has been diet controlled Recheck A1C, concern this may be elevated with weight gain, dietary choices       Relevant Orders   Hemoglobin A1c (Completed)   Lipid Panel (Completed)      Note: This dictation was prepared with Dragon dictation along with smaller phrase technology. Any transcriptional errors that result from this process are unintentional.

## 2019-03-15 ENCOUNTER — Encounter: Payer: Self-pay | Admitting: Family Medicine

## 2019-03-15 LAB — CBC WITH DIFFERENTIAL/PLATELET
Absolute Monocytes: 1065 cells/uL — ABNORMAL HIGH (ref 200–950)
Basophils Absolute: 43 cells/uL (ref 0–200)
Basophils Relative: 0.3 %
Eosinophils Absolute: 270 cells/uL (ref 15–500)
Eosinophils Relative: 1.9 %
HCT: 51.3 % — ABNORMAL HIGH (ref 38.5–50.0)
Hemoglobin: 17.1 g/dL (ref 13.2–17.1)
Lymphs Abs: 2698 cells/uL (ref 850–3900)
MCH: 30.9 pg (ref 27.0–33.0)
MCHC: 33.3 g/dL (ref 32.0–36.0)
MCV: 92.8 fL (ref 80.0–100.0)
MPV: 10.5 fL (ref 7.5–12.5)
Monocytes Relative: 7.5 %
Neutro Abs: 10125 cells/uL — ABNORMAL HIGH (ref 1500–7800)
Neutrophils Relative %: 71.3 %
Platelets: 296 10*3/uL (ref 140–400)
RBC: 5.53 10*6/uL (ref 4.20–5.80)
RDW: 11.7 % (ref 11.0–15.0)
Total Lymphocyte: 19 %
WBC: 14.2 10*3/uL — ABNORMAL HIGH (ref 3.8–10.8)

## 2019-03-15 LAB — COMPREHENSIVE METABOLIC PANEL
AG Ratio: 1.5 (calc) (ref 1.0–2.5)
ALT: 33 U/L (ref 9–46)
AST: 21 U/L (ref 10–40)
Albumin: 4.5 g/dL (ref 3.6–5.1)
Alkaline phosphatase (APISO): 64 U/L (ref 36–130)
BUN: 13 mg/dL (ref 7–25)
CO2: 26 mmol/L (ref 20–32)
Calcium: 9.7 mg/dL (ref 8.6–10.3)
Chloride: 101 mmol/L (ref 98–110)
Creat: 1.24 mg/dL (ref 0.60–1.35)
Globulin: 3 g/dL (calc) (ref 1.9–3.7)
Glucose, Bld: 92 mg/dL (ref 65–99)
Potassium: 4.6 mmol/L (ref 3.5–5.3)
Sodium: 139 mmol/L (ref 135–146)
Total Bilirubin: 0.7 mg/dL (ref 0.2–1.2)
Total Protein: 7.5 g/dL (ref 6.1–8.1)

## 2019-03-15 LAB — LIPID PANEL
Cholesterol: 119 mg/dL (ref <200)
HDL: 32 mg/dL — ABNORMAL LOW (ref >=40)
LDL Cholesterol (Calc): 65 mg/dL (calc)
Non-HDL Cholesterol (Calc): 87 mg/dL (calc) (ref <130)
Total CHOL/HDL Ratio: 3.7 (calc) (ref <5.0)
Triglycerides: 132 mg/dL (ref <150)

## 2019-03-15 LAB — HEMOGLOBIN A1C
Hgb A1c MFr Bld: 4.9 % of total Hgb (ref <5.7)
Mean Plasma Glucose: 94 (calc)
eAG (mmol/L): 5.2 (calc)

## 2019-03-15 NOTE — Assessment & Plan Note (Signed)
Uncontrolled, increase bystolic to 20mg  once a day  Continue norvascc Recheck fasting labs, renal function Discussed dietary changes Increase water, cut back fried foods, salt intake to start with  Recheck BP in 6 weeks

## 2019-03-15 NOTE — Assessment & Plan Note (Signed)
Has been diet controlled Recheck A1C, concern this may be elevated with weight gain, dietary choices

## 2019-03-19 ENCOUNTER — Encounter: Payer: Self-pay | Admitting: *Deleted

## 2020-03-12 ENCOUNTER — Other Ambulatory Visit: Payer: Self-pay | Admitting: Family Medicine

## 2020-04-10 ENCOUNTER — Other Ambulatory Visit: Payer: Self-pay | Admitting: Family Medicine

## 2020-04-13 ENCOUNTER — Other Ambulatory Visit: Payer: Self-pay | Admitting: Family Medicine

## 2020-04-18 ENCOUNTER — Other Ambulatory Visit: Payer: Self-pay | Admitting: Family Medicine

## 2020-04-21 ENCOUNTER — Telehealth: Payer: Self-pay | Admitting: Family Medicine

## 2020-04-21 NOTE — Telephone Encounter (Signed)
Patient has appointment scheduled.

## 2020-04-21 NOTE — Telephone Encounter (Signed)
°  Nebivolol HCI 20 mg

## 2020-05-12 ENCOUNTER — Other Ambulatory Visit: Payer: Self-pay | Admitting: Family Medicine

## 2020-05-13 ENCOUNTER — Other Ambulatory Visit: Payer: Self-pay | Admitting: Family Medicine

## 2020-05-21 ENCOUNTER — Encounter: Payer: BC Managed Care – PPO | Admitting: Family Medicine

## 2020-06-10 ENCOUNTER — Other Ambulatory Visit: Payer: Self-pay | Admitting: Family Medicine

## 2020-06-25 ENCOUNTER — Encounter: Payer: Self-pay | Admitting: Family Medicine

## 2020-06-25 ENCOUNTER — Other Ambulatory Visit: Payer: Self-pay

## 2020-06-25 ENCOUNTER — Ambulatory Visit (INDEPENDENT_AMBULATORY_CARE_PROVIDER_SITE_OTHER): Payer: BC Managed Care – PPO | Admitting: Family Medicine

## 2020-06-25 VITALS — BP 136/82 | HR 73 | Temp 98.0°F | Resp 18 | Ht 72.0 in | Wt 312.0 lb

## 2020-06-25 DIAGNOSIS — J302 Other seasonal allergic rhinitis: Secondary | ICD-10-CM

## 2020-06-25 DIAGNOSIS — Z0001 Encounter for general adult medical examination with abnormal findings: Secondary | ICD-10-CM | POA: Diagnosis not present

## 2020-06-25 DIAGNOSIS — E1165 Type 2 diabetes mellitus with hyperglycemia: Secondary | ICD-10-CM

## 2020-06-25 DIAGNOSIS — Z6841 Body Mass Index (BMI) 40.0 and over, adult: Secondary | ICD-10-CM

## 2020-06-25 DIAGNOSIS — Z1159 Encounter for screening for other viral diseases: Secondary | ICD-10-CM

## 2020-06-25 DIAGNOSIS — I10 Essential (primary) hypertension: Secondary | ICD-10-CM | POA: Diagnosis not present

## 2020-06-25 DIAGNOSIS — Z0000 Encounter for general adult medical examination without abnormal findings: Secondary | ICD-10-CM

## 2020-06-25 NOTE — Patient Instructions (Signed)
F/U John Cisneros 6 months

## 2020-06-25 NOTE — Assessment & Plan Note (Signed)
Goal A1C less than 7%, preferably below 6.5%

## 2020-06-25 NOTE — Assessment & Plan Note (Signed)
bp improved on recheck Check labs, protein levels Continue with dietary changes for weight loss, he has good  Momentum Also works out 3 days a week

## 2020-06-25 NOTE — Assessment & Plan Note (Signed)
Samples of zyrtec 10mg  once a day given

## 2020-06-25 NOTE — Progress Notes (Signed)
   Subjective:    Patient ID: John Cisneros, male    DOB: Dec 01, 1980, 40 y.o.   MRN: [id number]  Patient presents for Annual Exam and Allergies (Would like something for allergies)  Patient here for complete physical exam.  Last visit was in November 2020. HTN he is taking amlodipine 10 mg once a day and Bystolic 20 mg once a day. No SE with medications   Allergies itchy eyes, sneezing  ,runny nose for months  has not tried  Any for OTC meds   Type 2 diabetes mellitus no current medications.  His last A1c was 4.9% in 2020.  Was diagnosed in 2019 A1c was 7.5% at that time.  Proteinuria follows microscopic hematuria kidney stones.  He has not had any issues over the past year or so.  Weight down 18lbs since January, he has cut down on carbs, has cut down on sugar, on occ drinks zero soda   Eye doctor [facility]  Declines COVID/FLU shot   He now has 16 month old son  Review Of Systems:  GEN- denies fatigue, fever, weight loss,weakness, recent illness HEENT- denies eye drainage, change in vision, +nasal discharge, CVS- denies chest pain, palpitations RESP- denies SOB, cough, wheeze ABD- denies N/V, change in stools, abd pain GU- denies dysuria, hematuria, dribbling, incontinence MSK- denies joint pain, muscle aches, injury Neuro- denies headache, dizziness, syncope, seizure activity       Objective:    BP 136/82   Pulse 73   Temp 98 F (36.7 C) (Temporal)   Resp 18   Ht 6' (1.829 m)   Wt (!) 312 lb (141.5 kg)   SpO2 97%   BMI 42.31 kg/m  GEN- NAD, alert and oriented x3 ,obese  HEENT- PERRL, EOMI, non injected sclera, pink conjunctiva, MMM, oropharynx clear, nares clear, allergic appearance to eyes, watering, TM clear no effusion  Neck- Supple, no thyromegaly, no LAD  CVS- RRR, no murmur RESP-CTAB ABD-NABS,soft,NT,ND EXT- No edema Pulses- Radial, DP- 2+        Assessment & Plan:      Problem List Items Addressed This Visit      Unprioritized    Essential hypertension    bp improved on recheck Check labs, protein levels Continue with dietary changes for weight loss, he has good  Momentum Also works out 3 days a week       Relevant Orders   CBC with Differential/Platelet   Comprehensive metabolic panel   Obesity   Seasonal allergies    Samples of zyrtec 10mg  once a day given       Type 2 diabetes mellitus with hyperglycemia, without long-term current use of insulin (HCC)    Goal A1C less than 7%, preferably below 6.5%      Relevant Orders   Lipid panel   Hemoglobin A1c   Microalbumin / creatinine urine ratio    Other Visit Diagnoses    Routine general medical examination at a health care facility    -  Primary   CPE done, fasting labs obtained, hep c screening   Need for hepatitis C screening test       Relevant Orders   Hepatitis C antibody      Note: This dictation was prepared with Dragon dictation along with smaller phrase technology. Any transcriptional errors that result from this process are unintentional.

## 2020-06-26 LAB — LIPID PANEL
Cholesterol: 109 mg/dL (ref <200)
HDL: 35 mg/dL — ABNORMAL LOW (ref >=40)
LDL Cholesterol (Calc): 55 mg/dL (calc)
Non-HDL Cholesterol (Calc): 74 mg/dL (calc) (ref <130)
Total CHOL/HDL Ratio: 3.1 (calc) (ref <5.0)
Triglycerides: 109 mg/dL (ref <150)

## 2020-06-26 LAB — COMPREHENSIVE METABOLIC PANEL
AG Ratio: 1.4 (calc) (ref 1.0–2.5)
ALT: 37 U/L (ref 9–46)
AST: 23 U/L (ref 10–40)
Albumin: 4.2 g/dL (ref 3.6–5.1)
Alkaline phosphatase (APISO): 61 U/L (ref 36–130)
BUN: 15 mg/dL (ref 7–25)
CO2: 24 mmol/L (ref 20–32)
Calcium: 9.5 mg/dL (ref 8.6–10.3)
Chloride: 103 mmol/L (ref 98–110)
Creat: 1.16 mg/dL (ref 0.60–1.35)
Globulin: 2.9 g/dL (calc) (ref 1.9–3.7)
Glucose, Bld: 80 mg/dL (ref 65–99)
Potassium: 4.4 mmol/L (ref 3.5–5.3)
Sodium: 142 mmol/L (ref 135–146)
Total Bilirubin: 0.7 mg/dL (ref 0.2–1.2)
Total Protein: 7.1 g/dL (ref 6.1–8.1)

## 2020-06-26 LAB — CBC WITH DIFFERENTIAL/PLATELET
Absolute Monocytes: 823 cells/uL (ref 200–950)
Basophils Absolute: 48 cells/uL (ref 0–200)
Basophils Relative: 0.4 %
Eosinophils Absolute: 303 cells/uL (ref 15–500)
Eosinophils Relative: 2.5 %
HCT: 48.7 % (ref 38.5–50.0)
Hemoglobin: 16.4 g/dL (ref 13.2–17.1)
Lymphs Abs: 2577 cells/uL (ref 850–3900)
MCH: 31.6 pg (ref 27.0–33.0)
MCHC: 33.7 g/dL (ref 32.0–36.0)
MCV: 93.8 fL (ref 80.0–100.0)
MPV: 10.8 fL (ref 7.5–12.5)
Monocytes Relative: 6.8 %
Neutro Abs: 8349 cells/uL — ABNORMAL HIGH (ref 1500–7800)
Neutrophils Relative %: 69 %
Platelets: 309 10*3/uL (ref 140–400)
RBC: 5.19 10*6/uL (ref 4.20–5.80)
RDW: 11.8 % (ref 11.0–15.0)
Total Lymphocyte: 21.3 %
WBC: 12.1 10*3/uL — ABNORMAL HIGH (ref 3.8–10.8)

## 2020-06-26 LAB — MICROALBUMIN / CREATININE URINE RATIO
Creatinine, Urine: 202 mg/dL (ref 20–320)
Microalb Creat Ratio: 441 mcg/mg creat — ABNORMAL HIGH (ref <30)
Microalb, Ur: 89 mg/dL

## 2020-06-26 LAB — HEPATITIS C ANTIBODY
Hepatitis C Ab: NONREACTIVE
SIGNAL TO CUT-OFF: 0.14 (ref <1.00)

## 2020-06-26 LAB — HEMOGLOBIN A1C
Hgb A1c MFr Bld: 5.1 % of total Hgb (ref <5.7)
Mean Plasma Glucose: 100 mg/dL
eAG (mmol/L): 5.5 mmol/L

## 2020-06-27 ENCOUNTER — Encounter: Payer: Self-pay | Admitting: Family Medicine

## 2020-06-27 DIAGNOSIS — D72829 Elevated white blood cell count, unspecified: Secondary | ICD-10-CM | POA: Insufficient documentation

## 2020-07-02 ENCOUNTER — Other Ambulatory Visit: Payer: Self-pay | Admitting: Family Medicine

## 2020-07-04 ENCOUNTER — Telehealth: Payer: Self-pay

## 2020-07-04 NOTE — Telephone Encounter (Signed)
Patient called would like to know lab results. cb # [id number].

## 2020-07-07 ENCOUNTER — Other Ambulatory Visit: Payer: Self-pay | Admitting: *Deleted

## 2020-07-07 DIAGNOSIS — R809 Proteinuria, unspecified: Secondary | ICD-10-CM

## 2020-07-07 DIAGNOSIS — E1165 Type 2 diabetes mellitus with hyperglycemia: Secondary | ICD-10-CM

## 2020-07-07 DIAGNOSIS — I10 Essential (primary) hypertension: Secondary | ICD-10-CM

## 2020-07-07 DIAGNOSIS — D72829 Elevated white blood cell count, unspecified: Secondary | ICD-10-CM

## 2020-07-17 ENCOUNTER — Other Ambulatory Visit: Payer: Self-pay | Admitting: Family Medicine

## 2020-08-13 ENCOUNTER — Other Ambulatory Visit: Payer: Self-pay | Admitting: Family Medicine

## 2020-11-13 ENCOUNTER — Other Ambulatory Visit: Payer: Self-pay | Admitting: Nurse Practitioner

## 2020-11-17 ENCOUNTER — Other Ambulatory Visit: Payer: Self-pay | Admitting: Nurse Practitioner

## 2021-01-11 ENCOUNTER — Other Ambulatory Visit: Payer: Self-pay | Admitting: Nurse Practitioner

## 2021-02-11 ENCOUNTER — Other Ambulatory Visit: Payer: Self-pay | Admitting: Nurse Practitioner

## 2021-03-02 ENCOUNTER — Other Ambulatory Visit: Payer: Self-pay

## 2021-03-02 ENCOUNTER — Encounter: Payer: Self-pay | Admitting: Nurse Practitioner

## 2021-03-02 ENCOUNTER — Ambulatory Visit (INDEPENDENT_AMBULATORY_CARE_PROVIDER_SITE_OTHER): Payer: BC Managed Care – PPO | Admitting: Nurse Practitioner

## 2021-03-02 VITALS — BP 158/102 | HR 71 | Temp 98.6°F | Resp 18 | Ht 72.0 in | Wt 319.0 lb

## 2021-03-02 DIAGNOSIS — I10 Essential (primary) hypertension: Secondary | ICD-10-CM | POA: Diagnosis not present

## 2021-03-02 DIAGNOSIS — R809 Proteinuria, unspecified: Secondary | ICD-10-CM | POA: Diagnosis not present

## 2021-03-02 DIAGNOSIS — R739 Hyperglycemia, unspecified: Secondary | ICD-10-CM

## 2021-03-02 DIAGNOSIS — K649 Unspecified hemorrhoids: Secondary | ICD-10-CM

## 2021-03-02 DIAGNOSIS — E1165 Type 2 diabetes mellitus with hyperglycemia: Secondary | ICD-10-CM

## 2021-03-02 MED ORDER — BLOOD GLUCOSE TEST VI STRP: ORAL_STRIP | 99 refills | Status: DC

## 2021-03-02 MED ORDER — HYDROCORTISONE 2.5 % EX CREA: TOPICAL_CREAM | Freq: Two times a day (BID) | CUTANEOUS | 0 refills | Status: DC

## 2021-03-02 NOTE — Progress Notes (Signed)
Subjective:    Patient ID: John Cisneros, male    DOB: [birth year], 40 y.o.   MRN: [id number]  HPI: John Cisneros is a 40 y.o. male presenting for elevated blood sugar.  Chief Complaint  Patient presents with   Diabetes    At-home BG levels ~250   DIABETES Reports 3-4 years ago, was started on Metformin twice daily for diabetes.  After a couple of months, he was able to get completely off of medication by exercising and watching his diet.  He reports since then, he has stopped working out and watching what he is eating.  He still had his blood glucose meter so he checked his blood sugar at home and at one time it was 250.  He is worried he may have diabetes again.  Hypoglycemic episodes:no Polydipsia/polyuria: no Visual disturbance:  yes; blurred vision  Chest pain: no Paresthesias: no Glucose Monitoring: yes  Accucheck frequency: daily  Fasting glucose: 200, 212  Post prandial: 180-250 Taking Insulin?: no Blood Pressure Monitoring: not checking Retinal Examination: up to date; saw eye doctor month ago  Foot Exam: Up to Date Diabetic Education: Completed Pneumovax: Not up to Date Influenza: Not up to Date Aspirin: no Statin: No Physical activity: Not currently Dietary habits: Not currently watching dietary habits A1c goal: Less than 7%  HEMORRHOID Patient reports occasionally, he will have to strain to have a bowel movement.  When this happens, he sees a little bit of blood on the toilet paper.  This is happening more frequently recently. Duration: chronic Anal fullness: no Perianal itching/irritation: yes Perianal pain: yes Bright red rectal bleeding: yes Amount of blood: spotting Frequency: intermittent Constipation: yes Hard stools: yes Chronic straining/valsava: yes Alleviating factors: hydrocortisone cream Aggravating factors: hard BM, constiptaion Status: fluctuating Treatments attempted: hydrocortisone cream  Previous hemorrhoids: yes  Colonoscopy:  no  Allergies  Allergen Reactions   Lisinopril Swelling    Lip swelling. angioedema    Outpatient Encounter Medications as of 03/02/2021  Medication Sig   amLODipine (NORVASC) 10 MG tablet TAKE 1 TABLET BY MOUTH EVERY DAY   hydrocortisone 2.5 % cream Apply topically 2 (two) times daily.   Nebivolol HCl 20 MG TABS TAKE 1 TABLET BY MOUTH EVERY DAY   Blood Glucose Monitoring Suppl (BLOOD GLUCOSE SYSTEM PAK) KIT Please dispense based on patient and insurance preference. Use as directed to monitor FSBS up to 3x daily. Dx: E11.65.   Glucose Blood (BLOOD GLUCOSE TEST STRIPS) STRP Please dispense based on patient and insurance preference. Use as directed to monitor FSBS up to 3x daily. Dx: E11.65.   Lancets MISC Please dispense based on patient and insurance preference. Use as directed to monitor FSBS up to 3x daily. Dx: E11.65.   [DISCONTINUED] Glucose Blood (BLOOD GLUCOSE TEST STRIPS) STRP Please dispense based on patient and insurance preference. Use as directed to monitor FSBS up to 3x daily. Dx: E11.65.   No facility-administered encounter medications on file as of 03/02/2021.    Patient Active Problem List   Diagnosis Date Noted   Hemorrhoids 03/03/2021   Leukocytosis 06/27/2020   Type 2 diabetes mellitus with hyperglycemia, without long-term current use of insulin ([place]) 08/17/2017   Pityriasis rosea 01/19/2013   Proteinuria 05/22/2012   Seasonal allergies 05/22/2012   Kidney stone 03/01/2011   Microscopic hematuria 03/01/2011   Obesity 03/15/2006   Essential hypertension 03/15/2006    Past Medical History:  Diagnosis Date   Diabetes mellitus without complication ([place])    Hypertension  Kidney stone     Relevant past medical, surgical, family and social history reviewed and updated as indicated. Interim medical history since our last visit reviewed.  Review of Systems Per HPI unless specifically indicated above     Objective:    BP (!) 158/102 (BP Location: Left Arm,  Patient Position: Sitting)   Pulse 71   Temp 98.6 F (37 C) (Oral)   Resp 18   Ht 6' (1.829 m)   Wt (!) 319 lb (144.7 kg)   SpO2 97%   BMI 43.26 kg/m   Wt Readings from Last 3 Encounters:  03/02/21 (!) 319 lb (144.7 kg)  06/25/20 (!) 312 lb (141.5 kg)  03/14/19 (!) 302 lb (137 kg)    Physical Exam Vitals and nursing note reviewed.  Constitutional:      General: He is not in acute distress.    Appearance: Normal appearance. He is obese. He is not toxic-appearing.  Eyes:     General: No scleral icterus.    Extraocular Movements: Extraocular movements intact.  Neck:     Vascular: No carotid bruit.  Cardiovascular:     Rate and Rhythm: Normal rate.     Heart sounds: Normal heart sounds. No murmur heard. Pulmonary:     Effort: Pulmonary effort is normal. No respiratory distress.     Breath sounds: Normal breath sounds. No wheezing or rhonchi.  Genitourinary:    Comments: deferred Musculoskeletal:        General: Normal range of motion.     Cervical back: Normal range of motion.     Right lower leg: No edema.     Left lower leg: No edema.  Skin:    General: Skin is warm and dry.     Coloration: Skin is not jaundiced or pale.  Neurological:     Mental Status: He is alert and oriented to person, place, and time.     Motor: No weakness.     Gait: Gait normal.  Psychiatric:        Mood and Affect: Mood normal.        Behavior: Behavior normal.        Thought Content: Thought content normal.        Judgment: Judgment normal.      Assessment & Plan:   Problem List Items Addressed This Visit       Cardiovascular and Mediastinum   Hemorrhoids    Chronic.  Restart hydrocortisone 2.5% cream twice daily as needed.  Follow-up if this is not helping.      Relevant Medications   hydrocortisone 2.5 % cream   Essential hypertension - Primary    Chronic.  Currently taking amlodipine 10 mg daily and nebivolol 20 mg daily.  He does not check blood pressure at home.  Blood  pressure is elevated above goal of less than 130/80 today in clinic.  Suspect this is related to weight gain and dietary changes.  Discussed DASH diet and increasing physical activity.  I would like for patient to check his blood pressure at home and notify us if the readings are consistently 140/90 after starting back on the exercise and watching what he is eating.  Check urinalysis today for protein, will also check electrolytes with kidney function.  We will plan to follow-up pending blood work.         Endocrine   Type 2 diabetes mellitus with hyperglycemia, without long-term current use of insulin (HCC)    Chronic.  A1c goal less  than 7%.  Depending on A1c, will likely need to start back on metformin.  He may also qualify for GLP-1 agonist.  Eye exam is up-to-date.  Foot exam today normal.  He is not on a statin-can consider at future visit.  Follow-up pending blood work.      Relevant Medications   Glucose Blood (BLOOD GLUCOSE TEST STRIPS) STRP     Other   Proteinuria   Relevant Orders   Microalbumin, urine   Urinalysis, Routine w reflex microscopic (Completed)   Other Visit Diagnoses     Elevated blood sugar       Relevant Orders   Hemoglobin A1c (Completed)   COMPLETE METABOLIC PANEL WITH GFR (Completed)        Follow up plan: Return for pending lab work.

## 2021-03-03 ENCOUNTER — Other Ambulatory Visit: Payer: Self-pay

## 2021-03-03 DIAGNOSIS — K649 Unspecified hemorrhoids: Secondary | ICD-10-CM | POA: Insufficient documentation

## 2021-03-03 LAB — HEMOGLOBIN A1C
Hgb A1c MFr Bld: 7.6 % of total Hgb — ABNORMAL HIGH (ref <5.7)
Mean Plasma Glucose: 171 mg/dL
eAG (mmol/L): 9.5 mmol/L

## 2021-03-03 LAB — URINALYSIS, ROUTINE W REFLEX MICROSCOPIC
Bacteria, UA: NONE SEEN /HPF
Bilirubin Urine: NEGATIVE
Glucose, UA: NEGATIVE
Hyaline Cast: NONE SEEN /LPF
Ketones, ur: NEGATIVE
Leukocytes,Ua: NEGATIVE
Nitrite: NEGATIVE
Specific Gravity, Urine: 1.016 (ref 1.001–1.035)
Squamous Epithelial / HPF: NONE SEEN /HPF (ref <=5)
WBC, UA: NONE SEEN /HPF (ref 0–5)
pH: 5.5 (ref 5.0–8.0)

## 2021-03-03 LAB — COMPLETE METABOLIC PANEL WITH GFR
AG Ratio: 1.4 (calc) (ref 1.0–2.5)
ALT: 57 U/L — ABNORMAL HIGH (ref 9–46)
AST: 33 U/L (ref 10–40)
Albumin: 4.4 g/dL (ref 3.6–5.1)
Alkaline phosphatase (APISO): 77 U/L (ref 36–130)
BUN: 19 mg/dL (ref 7–25)
CO2: 26 mmol/L (ref 20–32)
Calcium: 9.9 mg/dL (ref 8.6–10.3)
Chloride: 100 mmol/L (ref 98–110)
Creat: 1.23 mg/dL (ref 0.60–1.26)
Globulin: 3.1 g/dL (calc) (ref 1.9–3.7)
Glucose, Bld: 202 mg/dL — ABNORMAL HIGH (ref 65–99)
Potassium: 4.4 mmol/L (ref 3.5–5.3)
Sodium: 137 mmol/L (ref 135–146)
Total Bilirubin: 0.5 mg/dL (ref 0.2–1.2)
Total Protein: 7.5 g/dL (ref 6.1–8.1)
eGFR: 77 mL/min/{1.73_m2} (ref >=60)

## 2021-03-03 LAB — MICROALBUMIN, URINE: Microalb, Ur: 59.9 mg/dL

## 2021-03-03 LAB — MICROSCOPIC MESSAGE

## 2021-03-03 MED ORDER — METFORMIN HCL 1000 MG PO TABS: 1000.0000 mg | ORAL_TABLET | Freq: Every day | ORAL | 3 refills | Status: DC

## 2021-03-03 NOTE — Assessment & Plan Note (Signed)
Chronic.  A1c goal less than 7%.  Depending on A1c, will likely need to start back on metformin.  He may also qualify for GLP-1 agonist.  Eye exam is up-to-date.  Foot exam today normal.  He is not on a statin-can consider at future visit.  Follow-up pending blood work.

## 2021-03-03 NOTE — Assessment & Plan Note (Addendum)
Chronic.  Currently taking amlodipine 10 mg daily and nebivolol 20 mg daily.  He does not check blood pressure at home.  Blood pressure is elevated above goal of less than 130/80 today in clinic.  Suspect this is related to weight gain and dietary changes.  Discussed DASH diet and increasing physical activity.  I would like for patient to check his blood pressure at home and notify us if the readings are consistently 140/90 after starting back on the exercise and watching what he is eating.  Check urinalysis today for protein, will also check electrolytes with kidney function.  We will plan to follow-up pending blood work.

## 2021-03-03 NOTE — Assessment & Plan Note (Signed)
Chronic.  Restart hydrocortisone 2.5% cream twice daily as needed.  Follow-up if this is not helping.

## 2021-03-04 ENCOUNTER — Telehealth: Payer: Self-pay | Admitting: Nurse Practitioner

## 2021-03-04 DIAGNOSIS — E1165 Type 2 diabetes mellitus with hyperglycemia: Secondary | ICD-10-CM

## 2021-03-04 MED ORDER — GLUCOSE BLOOD VI STRP: ORAL_STRIP | 12 refills | Status: DC

## 2021-03-04 NOTE — Telephone Encounter (Signed)
Patient left voicemail message to request provider change Rx for test strips; needs One Touch test strips instead of strips ordered.  Pharmacy confirmed as   [id number] [place], [place] - [address]  [address], [place] [address]  Phone:  [phone]  Fax:  [phone]  DEA #:  [id number]  Please advise at [phone].

## 2021-03-04 NOTE — Telephone Encounter (Signed)
Patient only has 2 test strips left. Please advise.

## 2021-03-04 NOTE — Telephone Encounter (Signed)
Spoke with pt, he states he needs strips for the OneTouch machine. He states the recent ones from the pharmacy do not fit his machine. [facility]. Pt advised to contact us if this does not resolve the issue.

## 2021-05-08 ENCOUNTER — Other Ambulatory Visit: Payer: Self-pay | Admitting: Nurse Practitioner

## 2021-05-27 ENCOUNTER — Encounter: Payer: Self-pay | Admitting: Nurse Practitioner

## 2021-05-27 ENCOUNTER — Other Ambulatory Visit: Payer: Self-pay

## 2021-05-27 ENCOUNTER — Ambulatory Visit (INDEPENDENT_AMBULATORY_CARE_PROVIDER_SITE_OTHER): Payer: BC Managed Care – PPO | Admitting: Nurse Practitioner

## 2021-05-27 VITALS — BP 152/100 | HR 80 | Ht 72.0 in | Wt 314.4 lb

## 2021-05-27 DIAGNOSIS — E1165 Type 2 diabetes mellitus with hyperglycemia: Secondary | ICD-10-CM

## 2021-05-27 DIAGNOSIS — I10 Essential (primary) hypertension: Secondary | ICD-10-CM

## 2021-05-27 MED ORDER — NEBIVOLOL HCL 20 MG PO TABS: 1.0000 | ORAL_TABLET | Freq: Every day | ORAL | 1 refills | Status: DC

## 2021-05-27 MED ORDER — AMLODIPINE BESYLATE 10 MG PO TABS: 10.0000 mg | ORAL_TABLET | Freq: Every day | ORAL | 1 refills | Status: DC

## 2021-05-27 NOTE — Progress Notes (Signed)
Subjective:    Patient ID: John Cisneros, male    DOB: December 29, 1980, 41 y.o.   MRN: [id number]  HPI: John Cisneros is a 41 y.o. male presenting for low blood sugar.  Chief Complaint  Patient presents with   Hypoglycemia   DIABETES Patient reports since the new year, he has been watching intake of simple sugars and carbohydrates.  He reports low blood sugar in 60s-80s when he takes Metformin.  He has held Metformin for the past week and blood sugars have been 80s-100s and he is feeling much better. Hypoglycemic episodes:no Polydipsia/polyuria: no Visual disturbance: no Chest pain: no Paresthesias: no Glucose Monitoring: yes  Accucheck frequency: couple times per day  Fasting glucose: 85-100  Evening: 100 Taking Insulin?: no Blood Pressure Monitoring: not checking Retinal Examination: Not up to Date; Encouraged him to schedule Foot Exam: Not up to Date Diabetic Education: Completed Pneumovax: had PNA 23 in 2019 Influenza: Not up to Date; declines Aspirin: no Statin: no Physical activity: not staying active; plans to get back in gym soon Dietary habits: does not eat a lot of carbohydrates, does not drink sugary  A1c goal: less than 7%  HYPERTENSION Currently taking amlodipine 10 mg daily and nebivolol 20 mg daily.   Hypertension status: controlled  BP monitoring frequency:  not checking Medication compliance: excellent Aspirin: no Recurrent headaches: no Visual changes: no Palpitations: no Dyspnea: no Chest pain: no Lower extremity edema: no Dizzy/lightheaded: no   Allergies  Allergen Reactions   Lisinopril Swelling    Lip swelling. angioedema    Outpatient Encounter Medications as of 05/27/2021  Medication Sig   Blood Glucose Monitoring Suppl (BLOOD GLUCOSE SYSTEM PAK) KIT Please dispense based on patient and insurance preference. Use as directed to monitor FSBS up to 3x daily. Dx: E11.65.   glucose blood test strip Use as instructed   Lancets MISC  Please dispense based on patient and insurance preference. Use as directed to monitor FSBS up to 3x daily. Dx: E11.65.   [DISCONTINUED] amLODipine (NORVASC) 10 MG tablet TAKE 1 TABLET BY MOUTH EVERY DAY   [DISCONTINUED] Nebivolol HCl 20 MG TABS TAKE 1 TABLET BY MOUTH EVERY DAY   amLODipine (NORVASC) 10 MG tablet Take 1 tablet (10 mg total) by mouth daily.   Nebivolol HCl 20 MG TABS Take 1 tablet (20 mg total) by mouth daily.   [DISCONTINUED] hydrocortisone 2.5 % cream Apply topically 2 (two) times daily.   [DISCONTINUED] metFORMIN (GLUCOPHAGE) 1000 MG tablet Take 1 tablet (1,000 mg total) by mouth daily with breakfast. (Patient not taking: Reported on 05/27/2021)   No facility-administered encounter medications on file as of 05/27/2021.    Patient Active Problem List   Diagnosis Date Noted   Hemorrhoids 03/03/2021   Leukocytosis 06/27/2020   Type 2 diabetes mellitus with hyperglycemia, without long-term current use of insulin ([place]) 08/17/2017   Pityriasis rosea 01/19/2013   Proteinuria 05/22/2012   Seasonal allergies 05/22/2012   Kidney stone 03/01/2011   Microscopic hematuria 03/01/2011   Obesity 03/15/2006   Essential hypertension 03/15/2006    Past Medical History:  Diagnosis Date   Diabetes mellitus without complication ([place])    Hypertension    Kidney stone     Relevant past medical, surgical, family and social history reviewed and updated as indicated. Interim medical history since our last visit reviewed.  Review of Systems Per HPI unless specifically indicated above     Objective:    BP (!) 152/100    Pulse  80    Ht 6' (1.829 m)    Wt (!) 314 lb 6.4 oz (142.6 kg)    SpO2 98%    BMI 42.64 kg/m   Wt Readings from Last 3 Encounters:  05/27/21 (!) 314 lb 6.4 oz (142.6 kg)  03/02/21 (!) 319 lb (144.7 kg)  06/25/20 (!) 312 lb (141.5 kg)    Physical Exam Vitals and nursing note reviewed.  Constitutional:      General: He is not in acute distress.    Appearance: Normal  appearance. He is obese. He is not toxic-appearing.  HENT:     Head: Normocephalic and atraumatic.  Cardiovascular:     Rate and Rhythm: Normal rate and regular rhythm.     Heart sounds: Normal heart sounds. No murmur heard. Pulmonary:     Effort: Pulmonary effort is normal. No respiratory distress.     Breath sounds: Normal breath sounds. No wheezing, rhonchi or rales.  Abdominal:     General: Abdomen is flat. Bowel sounds are normal.  Musculoskeletal:     Right lower leg: No edema.     Left lower leg: No edema.  Skin:    General: Skin is warm and dry.     Capillary Refill: Capillary refill takes less than 2 seconds.     Coloration: Skin is not jaundiced or pale.     Findings: No erythema.  Neurological:     Mental Status: He is alert and oriented to person, place, and time.     Motor: No weakness.     Gait: Gait normal.  Psychiatric:        Mood and Affect: Mood normal.        Behavior: Behavior normal.        Thought Content: Thought content normal.        Judgment: Judgment normal.      Assessment & Plan:   Problem List Items Addressed This Visit       Cardiovascular and Mediastinum   Essential hypertension    Chronic.  BP goal less than 130/80.  BP elevated above goal today in clinic.  Discussed options with patient.  He is on 2 types of blood pressure medication; maximum amlodipine 10 mg daily and nebivolol 20 mg daily.  He really does not want to increase or take more medication.  He appears to be motivated today to make lifestyle modifications.  Discussed DASH diet, smoking/nicotine cessation, drinking water, exercise.  Also discussed possibility of sleep apnea although patient denies snoring.  I encouraged patient work hard on increasing physical activity, smoking cessation , and check BP at home.  Continue current medications.  Follow up with new PCP if BP >130/80 consistently at home after 4 weeks of lifestyle modifications.      Relevant Medications   amLODipine  (NORVASC) 10 MG tablet   Nebivolol HCl 20 MG TABS     Endocrine   Type 2 diabetes mellitus with hyperglycemia, without long-term current use of insulin (HCC) - Primary    Chronic.  A1c goal less than 7%.  I suspect he is now at goal since he is not eating sugar/carbohydrates.  Can stop Metformin, encouraged continuing to check blood sugars and notify new PCP if readings elevated >120.  Foot exam, eye exam are due.  He declines further pneumonia vaccine and influenza vaccine.        Follow up plan: No follow-ups on file.

## 2021-05-27 NOTE — Assessment & Plan Note (Signed)
Chronic.  BP goal less than 130/80.  BP elevated above goal today in clinic.  Discussed options with patient.  He is on 2 types of blood pressure medication; maximum amlodipine 10 mg daily and nebivolol 20 mg daily.  He really does not want to increase or take more medication.  He appears to be motivated today to make lifestyle modifications.  Discussed DASH diet, smoking/nicotine cessation, drinking water, exercise.  Also discussed possibility of sleep apnea although patient denies snoring.  I encouraged patient work hard on increasing physical activity, smoking cessation , and check BP at home.  Continue current medications.  Follow up with new PCP if BP >130/80 consistently at home after 4 weeks of lifestyle modifications.

## 2021-05-27 NOTE — Assessment & Plan Note (Signed)
Chronic.  A1c goal less than 7%.  I suspect he is now at goal since he is not eating sugar/carbohydrates.  Can stop Metformin, encouraged continuing to check blood sugars and notify new PCP if readings elevated >120.  Foot exam, eye exam are due.  He declines further pneumonia vaccine and influenza vaccine.

## 2021-06-22 ENCOUNTER — Encounter: Payer: Self-pay | Admitting: Nurse Practitioner

## 2022-01-06 ENCOUNTER — Other Ambulatory Visit: Payer: Self-pay | Admitting: Nurse Practitioner

## 2022-01-06 DIAGNOSIS — I10 Essential (primary) hypertension: Secondary | ICD-10-CM

## 2022-01-07 ENCOUNTER — Other Ambulatory Visit: Payer: Self-pay | Admitting: Nurse Practitioner

## 2022-01-07 DIAGNOSIS — I10 Essential (primary) hypertension: Secondary | ICD-10-CM

## 2022-01-07 NOTE — Telephone Encounter (Signed)
Requested Prescriptions  Pending Prescriptions Disp Refills  . amLODipine (NORVASC) 10 MG tablet [Pharmacy Med Name: AMLODIPINE BESYLATE 10 MG TAB] 90 tablet 1    Sig: TAKE 1 TABLET BY MOUTH EVERY DAY     Cardiovascular: Calcium Channel Blockers 2 Failed - 01/06/2022  2:11 AM      Failed - Last BP in normal range    BP Readings from Last 1 Encounters:  05/27/21 (!) 152/100         Failed - Valid encounter within last 6 months    Recent Outpatient Visits          7 months ago Type 2 diabetes mellitus with hyperglycemia, without long-term current use of insulin (HCC)   [facility] Valentino Nose, NP   10 months ago Essential hypertension   [facility] Valentino Nose, NP   1 year ago Routine general medical examination at a health care facility   [facility] [place], Velna Hatchet, MD   2 years ago Type 2 diabetes mellitus with hyperglycemia, without long-term current use of insulin (HCC)   [facility] [place], Velna Hatchet, MD   4 years ago Essential hypertension   [facility] Allayne Butcher B, PA-C             Passed - Last Heart Rate in normal range    Pulse Readings from Last 1 Encounters:  05/27/21 80

## 2022-01-09 ENCOUNTER — Other Ambulatory Visit: Payer: Self-pay | Admitting: Nurse Practitioner

## 2022-01-09 DIAGNOSIS — I10 Essential (primary) hypertension: Secondary | ICD-10-CM

## 2022-01-11 NOTE — Telephone Encounter (Signed)
Requested by interface surescripts. prescriber no longer at practice. Requested Prescriptions  Refused Prescriptions Disp Refills  . amLODipine (NORVASC) 10 MG tablet [Pharmacy Med Name: AMLODIPINE BESYLATE 10 MG TAB] 90 tablet 1    Sig: TAKE 1 TABLET BY MOUTH EVERY DAY     Cardiovascular: Calcium Channel Blockers 2 Failed - 01/09/2022  2:06 PM      Failed - Last BP in normal range    BP Readings from Last 1 Encounters:  05/27/21 (!) 152/100         Failed - Valid encounter within last 6 months    Recent Outpatient Visits          7 months ago Type 2 diabetes mellitus with hyperglycemia, without long-term current use of insulin ([place])   [place] Eulogio Bear, NP   10 months ago Essential hypertension   [place] Eulogio Bear, NP   1 year ago Routine general medical examination at a health care facility   [place], Modena Nunnery, MD   2 years ago Type 2 diabetes mellitus with hyperglycemia, without long-term current use of insulin ([place])   [place], Modena Nunnery, MD   4 years ago Essential hypertension   [place], Mary B, PA-C             Passed - Last Heart Rate in normal range    Pulse Readings from Last 1 Encounters:  05/27/21 80

## 2022-01-18 ENCOUNTER — Ambulatory Visit: Payer: BC Managed Care – PPO | Admitting: Family Medicine

## 2022-01-18 VITALS — BP 160/98 | HR 83 | Temp 99.4°F | Ht 73.0 in | Wt 325.0 lb

## 2022-01-18 DIAGNOSIS — E1165 Type 2 diabetes mellitus with hyperglycemia: Secondary | ICD-10-CM | POA: Diagnosis not present

## 2022-01-18 DIAGNOSIS — Z0000 Encounter for general adult medical examination without abnormal findings: Secondary | ICD-10-CM | POA: Diagnosis not present

## 2022-01-18 DIAGNOSIS — Z6841 Body Mass Index (BMI) 40.0 and over, adult: Secondary | ICD-10-CM

## 2022-01-18 DIAGNOSIS — I10 Essential (primary) hypertension: Secondary | ICD-10-CM

## 2022-01-18 DIAGNOSIS — Z7689 Persons encountering health services in other specified circumstances: Secondary | ICD-10-CM

## 2022-01-18 MED ORDER — NEBIVOLOL HCL 20 MG PO TABS: 1.0000 | ORAL_TABLET | Freq: Every day | ORAL | 1 refills | Status: DC

## 2022-01-18 MED ORDER — AMLODIPINE BESYLATE 10 MG PO TABS: 10.0000 mg | ORAL_TABLET | Freq: Every day | ORAL | 1 refills | Status: DC

## 2022-01-18 NOTE — Progress Notes (Signed)
Complete physical exam  Patient: John Cisneros   DOB: 11/03/1980   41 y.o. Male  MRN: [id number]  Subjective:    Chief Complaint  Patient presents with   Establish Care    John Cisneros is a 41 y.o. male who presents today for a complete physical exam. He reports consuming a general diet. Gym/ health club routine includes cardio. He generally feels well. He reports sleeping well. He does not have additional problems to discuss today.   Hypertension: Patient here for follow-up of elevated blood pressure. He is exercising and is not adherent to low salt diet.  Blood pressure is not well controlled at home. Cardiac symptoms none. Patient denies none.  Cardiovascular risk factors: diabetes mellitus, hypertension, male gender, and obesity (BMI >= 30 kg/m2). Use of agents associated with hypertension: none. History of target organ damage: none.  DIABETES Patient reports diabetes is well controlled without medications Hypoglycemic episodes: no  Polydipsia/polyuria: no Visual disturbance: no Chest pain: no Paresthesias: no Glucose Monitoring: prn             Accucheck frequency: as needed             Fasting glucose: 90-110             Evening: unknown Taking Insulin: no Blood Pressure Monitoring: weekly at home Retinal Examination: declines Foot Exam: declines Diabetic Education: done Pneumovax: declines Influenza: declines Aspirin: no Statin: no Physical activity: started going to the gym Dietary habits: regular  A1c goal: less than 7%   Most recent fall risk assessment:    01/18/2022    4:20 PM  [place] in the past year? 0  Number falls in past yr: 0  Injury with Fall? 0     Most recent depression screenings:    01/18/2022    4:20 PM 03/14/2019    3:31 PM  PHQ 2/9 Scores  PHQ - 2 Score 0 0  PHQ- 9 Score 1     Vision:Within last year and Dental: No current dental problems and Receives regular dental care  Past Medical History:  Diagnosis Date    Diabetes mellitus without complication ([place])    Hypertension    Kidney stone    No past surgical history on file. Social History   Socioeconomic History   Marital status: Married    Spouse name: Not on file   Number of children: Not on file   Years of education: Not on file   Highest education level: Not on file  Occupational History   Not on file  Tobacco Use   Smoking status: Former    Packs/day: 0.50    Types: Cigarettes   Smokeless tobacco: Never  Vaping Use   Vaping Use: Never used  Substance and Sexual Activity   Alcohol use: Yes    Comment: occasional   Drug use: No   Sexual activity: Yes  Other Topics Concern   Not on file  Social History Narrative   Not on file   Social Determinants of Health   Financial Resource Strain: Not on file  Food Insecurity: Not on file  Transportation Needs: No Transportation Needs (03/14/2019)   PRAPARE - Hydrologist (Medical): No    Lack of Transportation (Non-Medical): No  Physical Activity: Inactive (03/14/2019)   Exercise Vital Sign    Days of Exercise per Week: 0 days    Minutes of Exercise per Session: 0 min  Stress: Not on  file  Social Connections: Unknown (03/14/2019)   Social Connection and Isolation Panel [NHANES]    Frequency of Communication with Friends and Family: Not on file    Frequency of Social Gatherings with Friends and Family: Not on file    Attends Religious Services: Not on file    Active Member of Clubs or Organizations: Not on file    Attends Archivist Meetings: Not on file    Marital Status: Married  Human resources officer Violence: Not on file   Family History  Problem Relation Age of Onset   Hypertension Father    Heart disease Maternal Grandmother    Heart disease Maternal Grandfather    Heart disease Paternal Grandmother    Heart disease Paternal Grandfather    Allergies  Allergen Reactions   Lisinopril Swelling    Lip swelling. angioedema       Patient Care Team: Rubie Maid, FNP as PCP - General (Family Medicine)   Outpatient Medications Prior to Visit  Medication Sig   Blood Glucose Monitoring Suppl (BLOOD GLUCOSE SYSTEM PAK) KIT Please dispense based on patient and insurance preference. Use as directed to monitor FSBS up to 3x daily. Dx: E11.65.   glucose blood test strip Use as instructed   Lancets MISC Please dispense based on patient and insurance preference. Use as directed to monitor FSBS up to 3x daily. Dx: E11.65.   [DISCONTINUED] amLODipine (NORVASC) 10 MG tablet Take 1 tablet (10 mg total) by mouth daily.   [DISCONTINUED] Nebivolol HCl 20 MG TABS Take 1 tablet (20 mg total) by mouth daily.   No facility-administered medications prior to visit.    Review of Systems  All other systems reviewed and are negative.         Objective:     BP (!) 160/98   Pulse 83   Temp 99.4 F (37.4 C) (Oral)   Ht 6' 1"  (1.854 m)   Wt (!) 325 lb (147.4 kg)   SpO2 95%   BMI 42.88 kg/m  BP Readings from Last 3 Encounters:  01/18/22 (!) 160/98  05/27/21 (!) 152/100  03/02/21 (!) 158/102   Wt Readings from Last 3 Encounters:  01/18/22 (!) 325 lb (147.4 kg)  05/27/21 (!) 314 lb 6.4 oz (142.6 kg)  03/02/21 (!) 319 lb (144.7 kg)      Physical Exam Vitals (declines physical exam) and nursing note reviewed.      No results found for any visits on 01/18/22. Last hemoglobin A1c Lab Results  Component Value Date   HGBA1C 7.6 (H) 03/02/2021        Assessment & Plan:    Routine Health Maintenance and Physical Exam  Immunization History  Administered Date(s) Administered   Hepatitis B 03/03/1994, 03/31/1994, 09/15/1994   Pneumococcal Polysaccharide-23 11/14/2017   Tdap 01/28/2015    Health Maintenance  Topic Date Due   COVID-19 Vaccine (1) Never done   HIV Screening  Never done   OPHTHALMOLOGY EXAM  07/04/2018   FOOT EXAM  09/01/2018   Diabetic kidney evaluation - Urine ACR  06/25/2021   HEMOGLOBIN  A1C  08/30/2021   INFLUENZA VACCINE  Never done   Diabetic kidney evaluation - GFR measurement  03/02/2022   TETANUS/TDAP  01/27/2025   Hepatitis C Screening  Completed   HPV VACCINES  Aged Out    Discussed health benefits of physical activity, and encouraged him to engage in regular exercise appropriate for his age and condition.  Problem List Items Addressed This Visit  Cardiovascular and Mediastinum   Essential hypertension   Relevant Medications   amLODipine (NORVASC) 10 MG tablet   Nebivolol HCl 20 MG TABS     Endocrine   Type 2 diabetes mellitus with hyperglycemia, without long-term current use of insulin (HCC)     Other   Obesity   Other Visit Diagnoses     Encounter to establish care with new doctor    -  Primary   Routine general medical examination at health care facility       Relevant Orders   Hemoglobin A1c   CBC with Differential/Platelet   COMPLETE METABOLIC PANEL WITH GFR   Lipid panel   Urine microalbumin-creatinine with uACR     Hypertension not controlled at this time. He has been out of his Amlodipine for several days. Home BP readings 140/90s, he checks them weekly. Will refill Amlodipine and continue Nebivolol. Daily home BP readings and follow up in office in 1 week for BP check and fasting labs. Encouraged heard healthy diet such as DASH and exercise. Diabetes is well controlled according to patient. He only checks BG at home when symptomatic and is not on any medications. Will check A1c. He declines eye exam, foot exam, and immunizations at this time.  Return in about 1 week (around 01/25/2022) for BP check and fasting labs.     Rubie Maid, FNP

## 2022-01-25 ENCOUNTER — Encounter: Payer: Self-pay | Admitting: Family Medicine

## 2022-01-25 ENCOUNTER — Ambulatory Visit: Payer: BC Managed Care – PPO | Admitting: Family Medicine

## 2022-01-25 VITALS — BP 160/100 | HR 80 | Temp 98.8°F | Ht 73.0 in | Wt 320.0 lb

## 2022-01-25 DIAGNOSIS — Z0000 Encounter for general adult medical examination without abnormal findings: Secondary | ICD-10-CM

## 2022-01-25 DIAGNOSIS — E1165 Type 2 diabetes mellitus with hyperglycemia: Secondary | ICD-10-CM

## 2022-01-25 DIAGNOSIS — I10 Essential (primary) hypertension: Secondary | ICD-10-CM | POA: Diagnosis not present

## 2022-01-25 MED ORDER — LOSARTAN POTASSIUM 50 MG PO TABS: 50.0000 mg | ORAL_TABLET | Freq: Every day | ORAL | 3 refills | Status: DC

## 2022-01-25 MED ORDER — NEBIVOLOL HCL 20 MG PO TABS: 2.0000 | ORAL_TABLET | Freq: Every day | ORAL | 3 refills | Status: DC

## 2022-01-25 MED ORDER — NEBIVOLOL HCL 20 MG PO TABS: 20.0000 mg | ORAL_TABLET | Freq: Every day | ORAL | 3 refills | Status: DC

## 2022-01-25 NOTE — Progress Notes (Signed)
Acute Office Visit  Subjective:     Patient ID: John Cisneros, male    DOB: Sep 16, 1980, 41 y.o.   MRN: [id number]  Chief Complaint  Patient presents with   Follow-up    1 wk f/u lab/bp check     HPI John Cisneros was recently seen 1 week ago to establish care. At that time his BP was elevated to 160/98 and he had been out of his amlodipine for several days. His home BP readings were 140/90s at that time and he was taking Nebivolol 84m daily and Amlodipine 160mdaily.  Hypertension, follow-up  BP Readings from Last 3 Encounters:  01/25/22 (!) 160/100  01/18/22 (!) 160/98  05/27/21 (!) 152/100   Wt Readings from Last 3 Encounters:  01/25/22 (!) 320 lb (145.2 kg)  01/18/22 (!) 325 lb (147.4 kg)  05/27/21 (!) 314 lb 6.4 oz (142.6 kg)     He was last seen for hypertension 7 days ago.  BP at that visit was 160/98. Management since that visit includes restarting Amlodipine 1077maily and continuing Nebivolol 36m44mily.  He reports excellent compliance with treatment. He is not having side effects.  He is following a Low Sodium, Diabetic diet. He is exercising. He does not smoke.  Use of agents associated with hypertension: none.   Outside blood pressures are 150/90, checked "at least once" at home. Symptoms: No chest pain No chest pressure  No palpitations No syncope  No dyspnea No orthopnea  No paroxysmal nocturnal dyspnea No lower extremity edema   Pertinent labs Lab Results  Component Value Date   CHOL 109 06/25/2020   HDL 35 (L) 06/25/2020   LDLCALC 55 06/25/2020   TRIG 109 06/25/2020   CHOLHDL 3.1 06/25/2020   Lab Results  Component Value Date   NA 137 03/02/2021   K 4.4 03/02/2021   CREATININE 1.23 03/02/2021   EGFR 77 03/02/2021   GLUCOSE 202 (H) 03/02/2021   TSH 1.527 05/20/2011     The ASCVD Risk score (Arnett DK, et al., 2019) failed to calculate for the following reasons:   The 2019 ASCVD risk score is only valid for ages 40 t53 79  100-------------------------------------------------------------------------------------------------  Review of Systems  Constitutional:  Positive for weight loss.  Eyes:  Negative for blurred vision.  Respiratory:  Negative for shortness of breath.   Cardiovascular:  Negative for chest pain, palpitations and leg swelling.  Neurological:  Negative for dizziness and headaches.        Objective:    BP (!) 160/100 (BP Location: Right Arm, Patient Position: Sitting)   Pulse 80   Temp 98.8 F (37.1 C) (Oral)   Ht 6' 1"  (1.854 m)   Wt (!) 320 lb (145.2 kg)   SpO2 98%   BMI 42.22 kg/m  BP Readings from Last 3 Encounters:  01/25/22 (!) 160/100  01/18/22 (!) 160/98  05/27/21 (!) 152/100   Wt Readings from Last 3 Encounters:  01/25/22 (!) 320 lb (145.2 kg)  01/18/22 (!) 325 lb (147.4 kg)  05/27/21 (!) 314 lb 6.4 oz (142.6 kg)      Physical Exam Vitals and nursing note reviewed.  Constitutional:      Appearance: Normal appearance. He is obese.  Cardiovascular:     Rate and Rhythm: Normal rate and regular rhythm.     Heart sounds: Normal heart sounds.  Pulmonary:     Effort: Pulmonary effort is normal.     Breath sounds: Normal breath sounds.  Neurological:  General: No focal deficit present.     Mental Status: He is alert.  Psychiatric:        Mood and Affect: Mood normal.        Behavior: Behavior normal.        Thought Content: Thought content normal.        Judgment: Judgment normal.     No results found for any visits on 01/25/22.      Assessment & Plan:   Essential hypertension - Plan: Lipid panel, COMPLETE METABOLIC PANEL WITH GFR, CBC with Differential/Platelet, Nebivolol HCl 20 MG TABS Chronic. BP goal less than 130/80. BP 142/88 goal today in clinic and 150/90 at home. Discussed treatment options with patient. He has resumed his Amlodipine 61m daily and is still taking Nebivolol 236mdaily since last visit last week. He is eating a low sodium,  carb conscious diet and exercising regularly, he has lost 7 pounds since last week and is very motivated. Plan is to add Losartan 5076mnce daily and continue Nebivolol 24m73mily and Amlodipine 10mg66mly with home BP checks 3-4 times a week and report via MyChart. Seek care for symtptoms of hypotension or hypertension such as dizziness, lightheadedness, headache, chest pain, shortness of breath, palpitations, leg swelling. Follow-up in 1 month.   Type 2 diabetes mellitus with hyperglycemia, without long-term current use of insulin (HCC) [place]: Urine microalbumin-creatinine with uACR, Hemoglobin A1c Will review today's labs and follow up as indicated    Return in about 4 weeks (around 02/22/2022).  AmberRubie Maid

## 2022-01-26 ENCOUNTER — Other Ambulatory Visit: Payer: Self-pay | Admitting: Family Medicine

## 2022-01-26 DIAGNOSIS — R809 Proteinuria, unspecified: Secondary | ICD-10-CM

## 2022-01-26 LAB — LIPID PANEL
Cholesterol: 110 mg/dL (ref <200)
HDL: 33 mg/dL — ABNORMAL LOW (ref >=40)
LDL Cholesterol (Calc): 53 mg/dL (calc)
Non-HDL Cholesterol (Calc): 77 mg/dL (calc) (ref <130)
Total CHOL/HDL Ratio: 3.3 (calc) (ref <5.0)
Triglycerides: 166 mg/dL — ABNORMAL HIGH (ref <150)

## 2022-01-26 LAB — CBC WITH DIFFERENTIAL/PLATELET
Absolute Monocytes: 848 cells/uL (ref 200–950)
Basophils Absolute: 30 cells/uL (ref 0–200)
Basophils Relative: 0.3 %
Eosinophils Absolute: 313 cells/uL (ref 15–500)
Eosinophils Relative: 3.1 %
HCT: 49.4 % (ref 38.5–50.0)
Hemoglobin: 16.6 g/dL (ref 13.2–17.1)
Lymphs Abs: 2222 cells/uL (ref 850–3900)
MCH: 31 pg (ref 27.0–33.0)
MCHC: 33.6 g/dL (ref 32.0–36.0)
MCV: 92.3 fL (ref 80.0–100.0)
MPV: 10.6 fL (ref 7.5–12.5)
Monocytes Relative: 8.4 %
Neutro Abs: 6686 cells/uL (ref 1500–7800)
Neutrophils Relative %: 66.2 %
Platelets: 275 10*3/uL (ref 140–400)
RBC: 5.35 10*6/uL (ref 4.20–5.80)
RDW: 11.8 % (ref 11.0–15.0)
Total Lymphocyte: 22 %
WBC: 10.1 10*3/uL (ref 3.8–10.8)

## 2022-01-26 LAB — COMPLETE METABOLIC PANEL WITH GFR
AG Ratio: 1.4 (calc) (ref 1.0–2.5)
ALT: 41 U/L (ref 9–46)
AST: 25 U/L (ref 10–40)
Albumin: 4.2 g/dL (ref 3.6–5.1)
Alkaline phosphatase (APISO): 68 U/L (ref 36–130)
BUN: 15 mg/dL (ref 7–25)
CO2: 24 mmol/L (ref 20–32)
Calcium: 9.2 mg/dL (ref 8.6–10.3)
Chloride: 104 mmol/L (ref 98–110)
Creat: 1.26 mg/dL (ref 0.60–1.26)
Globulin: 3.1 g/dL (calc) (ref 1.9–3.7)
Glucose, Bld: 132 mg/dL — ABNORMAL HIGH (ref 65–99)
Potassium: 4.4 mmol/L (ref 3.5–5.3)
Sodium: 140 mmol/L (ref 135–146)
Total Bilirubin: 0.6 mg/dL (ref 0.2–1.2)
Total Protein: 7.3 g/dL (ref 6.1–8.1)
eGFR: 74 mL/min/{1.73_m2} (ref >=60)

## 2022-01-26 LAB — MICROALBUMIN / CREATININE URINE RATIO
Creatinine, Urine: 209 mg/dL (ref 20–320)
Microalb Creat Ratio: 1192 mcg/mg creat — ABNORMAL HIGH (ref <30)
Microalb, Ur: 249.2 mg/dL

## 2022-01-26 LAB — HEMOGLOBIN A1C
Hgb A1c MFr Bld: 5.7 % of total Hgb — ABNORMAL HIGH (ref <5.7)
Mean Plasma Glucose: 117 mg/dL
eAG (mmol/L): 6.5 mmol/L

## 2022-02-09 NOTE — Addendum Note (Signed)
Addended by: Armin Yerger S on: 02/09/2022 12:20 PM   Modules accepted: Level of Service  

## 2022-02-15 ENCOUNTER — Telehealth: Payer: Self-pay

## 2022-02-15 NOTE — Telephone Encounter (Signed)
Pt called today stating that he has not been contacted for a Nephrology appt. However, a letter was sent out to pt on 01/26/22 via referral dept, [facility].   Per letter, told pt to called to scheduled an appt. Pt voiced understanding.   Referring To Provider Information [place] [address] [place] [address] [phone]

## 2022-03-01 ENCOUNTER — Ambulatory Visit: Payer: BC Managed Care – PPO | Admitting: Family Medicine

## 2022-03-09 ENCOUNTER — Ambulatory Visit: Payer: BC Managed Care – PPO | Admitting: Family Medicine

## 2022-03-09 ENCOUNTER — Encounter: Payer: Self-pay | Admitting: Family Medicine

## 2022-03-09 VITALS — BP 145/80 | HR 77 | Temp 98.1°F | Ht 73.0 in | Wt 322.0 lb

## 2022-03-09 DIAGNOSIS — I10 Essential (primary) hypertension: Secondary | ICD-10-CM | POA: Diagnosis not present

## 2022-03-09 DIAGNOSIS — Z6841 Body Mass Index (BMI) 40.0 and over, adult: Secondary | ICD-10-CM | POA: Diagnosis not present

## 2022-03-09 DIAGNOSIS — E1165 Type 2 diabetes mellitus with hyperglycemia: Secondary | ICD-10-CM | POA: Diagnosis not present

## 2022-03-09 MED ORDER — PHENTERMINE HCL 37.5 MG PO CAPS: 37.5000 mg | ORAL_CAPSULE | ORAL | 1 refills | Status: DC

## 2022-03-09 MED ORDER — LOSARTAN POTASSIUM 100 MG PO TABS: 100.0000 mg | ORAL_TABLET | Freq: Every day | ORAL | 0 refills | Status: DC

## 2022-03-09 NOTE — Patient Instructions (Signed)
Increase Losartan to 100mg  daily. Can take 2 50mg  tablets until you run out. Check home BP readings for 5 days and send them to me in MyChart.

## 2022-03-09 NOTE — Progress Notes (Unsigned)
Acute Office Visit  Subjective:     Patient ID: John Cisneros, male    DOB: 06-10-1980, 41 y.o.   MRN: [id number]  Chief Complaint  Patient presents with   Follow-up    4 week f/u meds-b/p     HPI Patient is in today for Hypertension, follow-up  BP Readings from Last 3 Encounters:  03/09/22 (!) 145/80  01/25/22 (!) 160/100  01/18/22 (!) 160/98   Wt Readings from Last 3 Encounters:  03/09/22 (!) 322 lb (146.1 kg)  01/25/22 (!) 320 lb (145.2 kg)  01/18/22 (!) 325 lb (147.4 kg)     He was last seen for hypertension 1 months ago.  BP at that visit was 160/100. Management since that visit includes Nebivolol 45m daily, amlodipine 154mdaily, and Losartan 5072maily.  He reports excellent compliance with treatment. He is not having side effects.  He is following a Low Sodium diet. He is exercising. He does not smoke.  Use of agents associated with hypertension: none.   Outside blood pressures are 140s/90s. Symptoms: No chest pain No chest pressure  No palpitations No syncope  No dyspnea No orthopnea  No paroxysmal nocturnal dyspnea No lower extremity edema   Pertinent labs Lab Results  Component Value Date   CHOL 110 01/25/2022   HDL 33 (L) 01/25/2022   LDLCALC 53 01/25/2022   TRIG 166 (H) 01/25/2022   CHOLHDL 3.3 01/25/2022   Lab Results  Component Value Date   NA 140 01/25/2022   K 4.4 01/25/2022   CREATININE 1.26 01/25/2022   EGFR 74 01/25/2022   GLUCOSE 132 (H) 01/25/2022   TSH 1.527 05/20/2011     The ASCVD Risk score (Arnett DK, et al., 2019) failed to calculate for the following reasons:   The 2019 ASCVD risk score is only valid for ages 40 49 79 79--------------------------------------------------------------------------------------------------   Review of Systems  All other systems reviewed and are negative.       Objective:    BP (!) 145/80   Pulse 77   Temp 98.1 F (36.7 C) (Oral)   Ht 6' 1" (1.854 m)   Wt (!) 322 lb (146.1  kg)   SpO2 100%   BMI 42.48 kg/m  BP Readings from Last 3 Encounters:  03/09/22 (!) 145/80  01/25/22 (!) 160/100  01/18/22 (!) 160/98      Physical Exam Vitals and nursing note reviewed.  Constitutional:      Appearance: Normal appearance. He is obese.  HENT:     Head: Normocephalic and atraumatic.  Cardiovascular:     Rate and Rhythm: Normal rate and regular rhythm.     Pulses: Normal pulses.     Heart sounds: Normal heart sounds.  Skin:    General: Skin is warm and dry.  Neurological:     General: No focal deficit present.     Mental Status: He is alert and oriented to person, place, and time. Mental status is at baseline.  Psychiatric:        Mood and Affect: Mood normal.        Behavior: Behavior normal.        Thought Content: Thought content normal.        Judgment: Judgment normal.     No results found for any visits on 03/09/22.      Assessment & Plan:   1. Essential hypertension BP remains elevated today in office. Will increase Cozaar to 100m51mily. Encouraged to check home BP x5  days after increase and report via MyChart. Seek medical care for chest pain, palpitations, dizziness, shortness of breath. Continue low sodium heart healthy diet. - losartan (COZAAR) 100 MG tablet; Take 1 tablet (100 mg total) by mouth daily.  Dispense: 90 tablet; Refill: 0 - COMPLETE METABOLIC PANEL WITH GFR  2. Class 3 severe obesity due to excess calories with serious comorbidity and body mass index (BMI) of 40.0 to 44.9 in adult [facility]) Patient has tried exercise and diet for some time and continues to have difficulty controlling his appetite. Is interested in weight loss medications. - COMPLETE METABOLIC PANEL WITH GFR    Return in about 4 weeks (around 04/06/2022) for BP check and weight management.  Rubie Maid, FNP

## 2022-03-10 ENCOUNTER — Telehealth: Payer: Self-pay

## 2022-03-10 ENCOUNTER — Other Ambulatory Visit: Payer: Self-pay | Admitting: Family Medicine

## 2022-03-10 DIAGNOSIS — R7303 Prediabetes: Secondary | ICD-10-CM

## 2022-03-10 DIAGNOSIS — I10 Essential (primary) hypertension: Secondary | ICD-10-CM

## 2022-03-10 MED ORDER — WEGOVY 0.25 MG/0.5ML ~~LOC~~ SOAJ: SUBCUTANEOUS | 2 refills | Status: AC

## 2022-03-10 NOTE — Telephone Encounter (Signed)
Dis regard this note, error

## 2022-03-10 NOTE — Telephone Encounter (Signed)
Per pcp, told pt to checks with his insurance to see what they cover. I would recommend medical weight management for this too.  Pt voiced understanding and nothing further.

## 2022-03-10 NOTE — Telephone Encounter (Addendum)
Per pt called his insurance, stated that we can always put in PA and as long as pt has a DX of pre-diabetes and high BP, pt should be approved for it.   You can send in wegovy and the pharmacy should start his PA for [place] to send to is insurance.  Pls advice

## 2022-03-10 NOTE — Telephone Encounter (Signed)
Pt is aware Rx is sent into pharmacy. Pt has appt on 04/2022

## 2022-05-04 ENCOUNTER — Other Ambulatory Visit: Payer: Self-pay | Admitting: Nephrology

## 2022-05-04 ENCOUNTER — Ambulatory Visit: Payer: BC Managed Care – PPO | Admitting: Family Medicine

## 2022-05-04 DIAGNOSIS — R809 Proteinuria, unspecified: Secondary | ICD-10-CM

## 2022-05-04 DIAGNOSIS — Z87442 Personal history of urinary calculi: Secondary | ICD-10-CM

## 2022-05-24 ENCOUNTER — Ambulatory Visit
Admission: RE | Admit: 2022-05-24 | Discharge: 2022-05-24 | Disposition: A | Discharge status: Home / Self Care | Payer: BC Managed Care – PPO | Source: Ambulatory Visit | Attending: Nephrology | Admitting: Nephrology

## 2022-05-24 DIAGNOSIS — Z87442 Personal history of urinary calculi: Secondary | ICD-10-CM

## 2022-05-24 DIAGNOSIS — R809 Proteinuria, unspecified: Secondary | ICD-10-CM

## 2022-06-01 ENCOUNTER — Telehealth: Payer: BC Managed Care – PPO | Admitting: Family Medicine

## 2022-06-01 ENCOUNTER — Encounter: Payer: Self-pay | Admitting: Family Medicine

## 2022-06-01 VITALS — BP 140/100 | HR 88 | Ht 73.0 in | Wt 318.0 lb

## 2022-06-01 DIAGNOSIS — E1165 Type 2 diabetes mellitus with hyperglycemia: Secondary | ICD-10-CM

## 2022-06-01 DIAGNOSIS — I10 Essential (primary) hypertension: Secondary | ICD-10-CM | POA: Diagnosis not present

## 2022-06-01 DIAGNOSIS — Z6841 Body Mass Index (BMI) 40.0 and over, adult: Secondary | ICD-10-CM | POA: Diagnosis not present

## 2022-06-01 NOTE — Progress Notes (Signed)
Virtual Visit via Video note  I connected with John Cisneros on 06/01/22 at 1556 by video and verified that I am speaking with the correct person using two identifiers. John Cisneros is currently located at [place] and staff is currently with him during visit. The provider, Rubie Maid, FNP is located in their home at time of visit.  I discussed the limitations, risks, security and privacy concerns of performing an evaluation and management service by video and the availability of in person appointments. I also discussed with the patient that there may be a patient responsible charge related to this service. The patient expressed understanding and agreed to proceed.  Subjective: PCP: Rubie Maid, FNP  Chief Complaint  Patient presents with   Follow-up    4 wk f/u     Patient is here today for blood pressure follow up and weight management. He reports his BP at home is 130-140/80s. He has made some dietary changes such as stopping meat and reducing salt. He is also exercising walking and going to the gym. He has lost 7lbs. Currently he is taking Amlodipine 10mg  daily, Losartan 100mg  daily, and Nebivolol 20mg  daily. Denies chest pain, shortness of breath, palpitations, lightheadedness, dizziness, swelling in lower extremities, headaches.    ROS: Per HPI  Current Outpatient Medications:    amLODipine (NORVASC) 10 MG tablet, Take 1 tablet (10 mg total) by mouth daily., Disp: 90 tablet, Rfl: 1   Blood Glucose Monitoring Suppl (BLOOD GLUCOSE SYSTEM PAK) KIT, Please dispense based on patient and insurance preference. Use as directed to monitor FSBS up to 3x daily. Dx: E11.65., Disp: 1 each, Rfl: 1   glucose blood test strip, Use as instructed, Disp: 100 each, Rfl: 12   Lancets MISC, Please dispense based on patient and insurance preference. Use as directed to monitor FSBS up to 3x daily. Dx: E11.65., Disp: 100 each, Rfl: 1   losartan (COZAAR) 100 MG tablet,  Take 1 tablet (100 mg total) by mouth daily., Disp: 90 tablet, Rfl: 0   Nebivolol HCl 20 MG TABS, Take 1 tablet (20 mg total) by mouth daily., Disp: 90 tablet, Rfl: 3  Past Medical History:  Diagnosis Date   Diabetes mellitus without complication ([place])    Hypertension    Kidney stone    No past surgical history on file. Allergies  Allergen Reactions   Lisinopril Swelling    Lip swelling. angioedema     Observations/Objective: Physical Exam Constitutional:      Appearance: Normal appearance.  Neurological:     General: No focal deficit present.     Mental Status: He is alert and oriented to person, place, and time.  Psychiatric:        Mood and Affect: Mood normal.        Behavior: Behavior normal.        Thought Content: Thought content normal.        Judgment: Judgment normal.       06/01/2022    3:59 PM 06/01/2022    3:55 PM 03/09/2022    3:39 PM  Vitals with BMI  Height  6\' 1"    Weight  318 lbs   BMI  38.75   Systolic 643 329 518  Diastolic 841 98 80  Pulse  88      Assessment and Plan: Essential hypertension Assessment & Plan: Chronic. BP remains elevated in office today. He reports BP 130s/80s at home and he checks at least weekly.  I instructed him to report to office values sustained above 140/90. He is  motivated to make lifestyle modifications at this time including diet and exercise for weight loss. Will seek medical care for chest pain, palpitations, dyspnea on exertion, lightheadedness, dizziness. Return to office in 3 months. Will refer to hypertension clinic for tighter control.  Orders: -     Ambulatory referral to [facility]  Type 2 diabetes mellitus with hyperglycemia, without long-term current use of insulin ([place]) Assessment & Plan: Chronic. A1c goal less than 7%. Last A1c 5.7%. He reports fasting BP 90-120. Instructed to notify office for elevations in blood glucose.   Class 3 severe obesity due to excess  calories with serious comorbidity and body mass index (BMI) of 40.0 to 44.9 in adult [facility]) Assessment & Plan: Declines MWM. He is working on modifying his diet and incorporating exercise on his own and would like to continue with this.      Follow Up Instructions: Return in about 3 months (around 08/30/2022) for BP.   I discussed the assessment and treatment plan with the patient. The patient was provided an opportunity to ask questions and all were answered. The patient agreed with the plan and demonstrated an understanding of the instructions.   The patient was advised to call back or seek an in-person evaluation if the symptoms worsen or if the condition fails to improve as anticipated.  The above assessment and management plan was discussed with the patient. The patient verbalized understanding of and has agreed to the management plan. Patient is aware to call the clinic if symptoms persist or worsen. Patient is aware when to return to the clinic for a follow-up visit. Patient educated on when it is appropriate to go to the emergency department.   Time call ended: 1605  I provided 9 minutes of face-to-face time during this encounter.   Mila Merry, MSN, APRN, FNP-C [place]

## 2022-06-01 NOTE — Assessment & Plan Note (Signed)
Chronic. A1c goal less than 7%. Last A1c 5.7%. He reports fasting BP 90-120. Instructed to notify office for elevations in blood glucose.

## 2022-06-01 NOTE — Assessment & Plan Note (Addendum)
Chronic. BP remains elevated in office today. He reports BP 130s/80s at home and he checks at least weekly. I instructed him to report to office values sustained above 140/90. He is  motivated to make lifestyle modifications at this time including diet and exercise for weight loss. Will seek medical care for chest pain, palpitations, dyspnea on exertion, lightheadedness, dizziness. Return to office in 3 months. Will refer to hypertension clinic for tighter control.

## 2022-06-01 NOTE — Assessment & Plan Note (Signed)
Declines MWM. He is working on modifying his diet and incorporating exercise on his own and would like to continue with this.

## 2022-06-23 ENCOUNTER — Other Ambulatory Visit: Payer: Self-pay | Admitting: Family Medicine

## 2022-06-23 DIAGNOSIS — I10 Essential (primary) hypertension: Secondary | ICD-10-CM

## 2022-07-11 ENCOUNTER — Other Ambulatory Visit: Payer: Self-pay | Admitting: Family Medicine

## 2022-07-11 DIAGNOSIS — I10 Essential (primary) hypertension: Secondary | ICD-10-CM

## 2022-07-12 NOTE — Telephone Encounter (Signed)
Please check on the updates of the referral please ref# [phone]

## 2022-07-27 ENCOUNTER — Ambulatory Visit: Payer: BC Managed Care – PPO | Admitting: Family Medicine

## 2022-07-27 VITALS — BP 146/102 | HR 82 | Temp 98.4°F | Ht 73.0 in | Wt 311.0 lb

## 2022-07-27 DIAGNOSIS — I10 Essential (primary) hypertension: Secondary | ICD-10-CM | POA: Diagnosis not present

## 2022-07-27 DIAGNOSIS — E1165 Type 2 diabetes mellitus with hyperglycemia: Secondary | ICD-10-CM | POA: Diagnosis not present

## 2022-07-27 DIAGNOSIS — Z6841 Body Mass Index (BMI) 40.0 and over, adult: Secondary | ICD-10-CM | POA: Diagnosis not present

## 2022-07-27 MED ORDER — NEBIVOLOL HCL 20 MG PO TABS: 30.0000 mg | ORAL_TABLET | Freq: Every day | ORAL | 3 refills | Status: DC

## 2022-07-27 MED ORDER — WEGOVY 0.25 MG/0.5ML ~~LOC~~ SOAJ: SUBCUTANEOUS | 0 refills | Status: AC

## 2022-07-27 NOTE — Assessment & Plan Note (Signed)
Chronic. BP remains elevated in office today. He reports BP 130-140/90s at home and he checks at least weekly. I instructed him to report to office values sustained above 140/90. He is  motivated to make lifestyle modifications at this time including diet and exercise for weight loss. Will seek medical care for chest pain, palpitations, dyspnea on exertion, lightheadedness, dizziness. Appt with HTN clinic in May. Will increase Nebivolol to 30mg  daily and he will report home BP readings to office in 1 week.

## 2022-07-27 NOTE — Progress Notes (Signed)
Acute Office Visit  Subjective:     Patient ID: John Cisneros, male    DOB: 1981/02/10, 42 y.o.   MRN: [id number]  Chief Complaint  Patient presents with   Follow-up    Blood pressure check     HPI Patient is in today for hypertension follow-up. He is currently taking Amlodipine 10mg  daily, Losartan 100mg  daily, and Nebivolol 20mg  daily.  HYPERTENSION without Chronic Kidney Disease Hypertension status: uncontrolled  Satisfied with current treatment? yes Duration of hypertension: months BP monitoring frequency:  a few times a week BP range: 138/95 BP medication side effects:  no Medication compliance: excellent compliance Previous BP meds:amlodipine and losartan (cozaar), lisinopril,  Aspirin: no Recurrent headaches: no Visual changes: no Palpitations: no Dyspnea: no Chest pain: no Lower extremity edema: no Dizzy/lightheaded: no   Review of Systems  All other systems reviewed and are negative.       Objective:    BP (!) 146/102   Pulse 82   Temp 98.4 F (36.9 C) (Oral)   Ht 6\' 1"  (1.854 m)   Wt (!) 311 lb (141.1 kg)   SpO2 98%   BMI 41.03 kg/m  BP Readings from Last 3 Encounters:  07/27/22 (!) 146/102  06/01/22 (!) 140/100  03/09/22 (!) 145/80      Physical Exam Vitals and nursing note reviewed.  Constitutional:      Appearance: Normal appearance. He is normal weight.  HENT:     Head: Normocephalic and atraumatic.  Cardiovascular:     Rate and Rhythm: Normal rate and regular rhythm.     Pulses: Normal pulses.     Heart sounds: Normal heart sounds.  Pulmonary:     Effort: Pulmonary effort is normal.     Breath sounds: Normal breath sounds.  Skin:    General: Skin is warm and dry.     Capillary Refill: Capillary refill takes less than 2 seconds.  Neurological:     General: No focal deficit present.     Mental Status: He is alert and oriented to person, place, and time. Mental status is at baseline.  Psychiatric:        Mood and Affect:  Mood normal.        Behavior: Behavior normal.        Thought Content: Thought content normal.        Judgment: Judgment normal.     No results found for any visits on 07/27/22.      Assessment & Plan:   Problem List Items Addressed This Visit       Cardiovascular and Mediastinum   Essential hypertension - Primary    Chronic. BP remains elevated in office today. He reports BP 130-140/90s at home and he checks at least weekly. I instructed him to report to office values sustained above 140/90. He is  motivated to make lifestyle modifications at this time including diet and exercise for weight loss. Will seek medical care for chest pain, palpitations, dyspnea on exertion, lightheadedness, dizziness. Appt with HTN clinic in May. Will increase Nebivolol to 30mg  daily and he will report home BP readings to office in 1 week.      Relevant Medications   Nebivolol HCl 20 MG TABS     Endocrine   Type 2 diabetes mellitus with hyperglycemia, without long-term current use of insulin   Relevant Medications   dapagliflozin propanediol (FARXIGA) 10 MG TABS tablet     Other   Class 3 severe obesity due to excess  calories with serious comorbidity and body mass index (BMI) of 40.0 to 44.9 in adult    Unsuccessful with limiting calories and maintaining an exercise routine. He is interested in starting medication to help with weight loss. Will start [facility] 0.25mg  SQ weekly for 4 weeks then increase to 0.5mg  weekly. Denies family history of thyroid c-cell tumors or MEN2      Relevant Medications   Semaglutide-Weight Management (WEGOVY) 0.25 MG/0.5ML SOAJ   dapagliflozin propanediol (FARXIGA) 10 MG TABS tablet    Meds ordered this encounter  Medications   Semaglutide-Weight Management (WEGOVY) 0.25 MG/0.5ML SOAJ    Sig: Inject 0.25 mg into the skin once a week for 28 days, THEN 0.5 mg once a week for 28 days.    Dispense:  6 mL    Refill:  0    Order Specific Question:   Supervising Provider     Answer:   Jenna Luo T [[id number]]   Nebivolol HCl 20 MG TABS    Sig: Take 1.5 tablets (30 mg total) by mouth daily.    Dispense:  90 tablet    Refill:  3    Order Specific Question:   Supervising Provider    Answer:   Jenna Luo T [[id number]]    Return in about 4 weeks (around 08/24/2022) for BP, weight loss.  Rubie Maid, FNP

## 2022-07-27 NOTE — Assessment & Plan Note (Addendum)
Unsuccessful with limiting calories and maintaining an exercise routine. He is interested in starting medication to help with weight loss. Will start [facility] 0.25mg  SQ weekly for 4 weeks then increase to 0.5mg  weekly. Denies family history of thyroid c-cell tumors or MEN2

## 2022-09-15 ENCOUNTER — Institutional Professional Consult (permissible substitution) (HOSPITAL_BASED_OUTPATIENT_CLINIC_OR_DEPARTMENT_OTHER): Payer: BC Managed Care – PPO | Admitting: Cardiovascular Disease

## 2022-09-20 ENCOUNTER — Other Ambulatory Visit: Payer: Self-pay | Admitting: Family Medicine

## 2022-09-20 DIAGNOSIS — I10 Essential (primary) hypertension: Secondary | ICD-10-CM

## 2022-11-14 ENCOUNTER — Other Ambulatory Visit: Payer: Self-pay | Admitting: Family Medicine

## 2022-11-14 DIAGNOSIS — I10 Essential (primary) hypertension: Secondary | ICD-10-CM

## 2022-11-17 ENCOUNTER — Institutional Professional Consult (permissible substitution) (HOSPITAL_BASED_OUTPATIENT_CLINIC_OR_DEPARTMENT_OTHER): Payer: BC Managed Care – PPO | Admitting: Cardiovascular Disease

## 2022-11-25 ENCOUNTER — Encounter (HOSPITAL_BASED_OUTPATIENT_CLINIC_OR_DEPARTMENT_OTHER): Payer: Self-pay

## 2022-12-22 ENCOUNTER — Other Ambulatory Visit: Payer: Self-pay | Admitting: Family Medicine

## 2022-12-22 DIAGNOSIS — I10 Essential (primary) hypertension: Secondary | ICD-10-CM

## 2023-01-05 ENCOUNTER — Other Ambulatory Visit: Payer: Self-pay | Admitting: Family Medicine

## 2023-01-05 DIAGNOSIS — I10 Essential (primary) hypertension: Secondary | ICD-10-CM

## 2023-01-06 NOTE — Telephone Encounter (Signed)
Due to a system glitch the last office visit for this practice is not detected correctly.   LOV 07/27/2022.      Requested Prescriptions  Pending Prescriptions Disp Refills   amLODipine (NORVASC) 10 MG tablet [Pharmacy Med Name: AMLODIPINE BESYLATE 10 MG TAB] 90 tablet 0    Sig: TAKE 1 TABLET BY MOUTH EVERY DAY     Cardiovascular: Calcium Channel Blockers 2 Failed - 01/05/2023  1:28 AM      Failed - Last BP in normal range    BP Readings from Last 1 Encounters:  07/27/22 (!) 146/102         Failed - Valid encounter within last 6 months    Recent Outpatient Visits           1 year ago Type 2 diabetes mellitus with hyperglycemia, without long-term current use of insulin (HCC)   [facility] Valentino Nose, NP   1 year ago Essential hypertension   [facility] Valentino Nose, NP   2 years ago Routine general medical examination at a health care facility   [facility] [place], Velna Hatchet, MD   3 years ago Type 2 diabetes mellitus with hyperglycemia, without long-term current use of insulin (HCC)   [facility] [place], Velna Hatchet, MD   5 years ago Essential hypertension   [facility] Allayne Butcher B, PA-C              Passed - Last Heart Rate in normal range    Pulse Readings from Last 1 Encounters:  07/27/22 82

## 2023-01-26 ENCOUNTER — Institutional Professional Consult (permissible substitution) (HOSPITAL_BASED_OUTPATIENT_CLINIC_OR_DEPARTMENT_OTHER): Payer: BC Managed Care – PPO | Admitting: Cardiovascular Disease

## 2023-03-18 ENCOUNTER — Other Ambulatory Visit: Payer: Self-pay | Admitting: Family Medicine

## 2023-03-18 DIAGNOSIS — I10 Essential (primary) hypertension: Secondary | ICD-10-CM

## 2023-03-27 ENCOUNTER — Other Ambulatory Visit: Payer: Self-pay | Admitting: Family Medicine

## 2023-03-27 DIAGNOSIS — I10 Essential (primary) hypertension: Secondary | ICD-10-CM

## 2023-03-28 ENCOUNTER — Telehealth: Payer: Self-pay

## 2023-03-28 NOTE — Telephone Encounter (Signed)
Spoke w/pt re diabetic eye exam. Per pt have a eye appt next week. Also told pt to schedule his annual appt w/NP as well.

## 2023-04-26 ENCOUNTER — Emergency Department (HOSPITAL_COMMUNITY): Payer: BC Managed Care – PPO

## 2023-04-26 ENCOUNTER — Other Ambulatory Visit: Payer: Self-pay

## 2023-04-26 ENCOUNTER — Encounter (HOSPITAL_COMMUNITY): Payer: Self-pay

## 2023-04-26 ENCOUNTER — Emergency Department (HOSPITAL_COMMUNITY)
Admission: EM | Admit: 2023-04-26 | Discharge: 2023-04-27 | Disposition: A | Discharge status: Home / Self Care | Payer: BC Managed Care – PPO | Attending: Emergency Medicine | Admitting: Emergency Medicine

## 2023-04-26 DIAGNOSIS — I10 Essential (primary) hypertension: Secondary | ICD-10-CM | POA: Insufficient documentation

## 2023-04-26 DIAGNOSIS — R079 Chest pain, unspecified: Secondary | ICD-10-CM | POA: Diagnosis present

## 2023-04-26 DIAGNOSIS — Z79899 Other long term (current) drug therapy: Secondary | ICD-10-CM | POA: Insufficient documentation

## 2023-04-26 DIAGNOSIS — R0789 Other chest pain: Secondary | ICD-10-CM | POA: Insufficient documentation

## 2023-04-26 DIAGNOSIS — E119 Type 2 diabetes mellitus without complications: Secondary | ICD-10-CM | POA: Diagnosis not present

## 2023-04-26 LAB — CBC WITH DIFFERENTIAL/PLATELET
Abs Immature Granulocytes: 0.04 10*3/uL (ref 0.00–0.07)
Basophils Absolute: 0.1 10*3/uL (ref 0.0–0.1)
Basophils Relative: 0 %
Eosinophils Absolute: 0.3 10*3/uL (ref 0.0–0.5)
Eosinophils Relative: 2 %
HCT: 51.9 % (ref 39.0–52.0)
Hemoglobin: 16.9 g/dL (ref 13.0–17.0)
Immature Granulocytes: 0 %
Lymphocytes Relative: 20 %
Lymphs Abs: 2.5 10*3/uL (ref 0.7–4.0)
MCH: 30.7 pg (ref 26.0–34.0)
MCHC: 32.6 g/dL (ref 30.0–36.0)
MCV: 94.2 fL (ref 80.0–100.0)
Monocytes Absolute: 1 10*3/uL (ref 0.1–1.0)
Monocytes Relative: 8 %
Neutro Abs: 8.5 10*3/uL — ABNORMAL HIGH (ref 1.7–7.7)
Neutrophils Relative %: 70 %
Platelets: 286 10*3/uL (ref 150–400)
RBC: 5.51 MIL/uL (ref 4.22–5.81)
RDW: 12.4 % (ref 11.5–15.5)
WBC: 12.4 10*3/uL — ABNORMAL HIGH (ref 4.0–10.5)
nRBC: 0 % (ref 0.0–0.2)

## 2023-04-26 LAB — TROPONIN I (HIGH SENSITIVITY): Troponin I (High Sensitivity): 4 ng/L (ref <18)

## 2023-04-26 NOTE — ED Provider Triage Note (Signed)
 Emergency Medicine Provider Triage Evaluation Note  John Cisneros , a 42 y.o. male  was evaluated in triage.  Pt complains of left sided chest pain into the left shoulder and arm for the past 2 days.  Endorses mild shortness of breath with it.  Hx of hypertension and diabetes.    Review of Systems  Positive: As above Negative: As above  Physical Exam  BP (!) 179/106 (BP Location: Left Arm)   Pulse 79   Temp 98.7 F (37.1 C) (Oral)   Resp 18   SpO2 97%  Gen:   Awake, no distress   Resp:  Normal effort  MSK:   Moves extremities without difficulty  Other:    Medical Decision Making  Medically screening exam initiated at 3:07 PM.  Appropriate orders placed.  John Cisneros was informed that the remainder of the evaluation will be completed by another provider, this initial triage assessment does not replace that evaluation, and the importance of remaining in the ED until their evaluation is complete.     Gretta Sayres R, [place] 04/26/23 [phone]

## 2023-04-26 NOTE — ED Triage Notes (Signed)
 Pt reports with left sharp achy chest pain and left arm pain x 2 days. Pt reports a hx of HTN.

## 2023-04-26 NOTE — ED Notes (Signed)
Lab said to recollect the lt green.

## 2023-04-27 DIAGNOSIS — R0789 Other chest pain: Secondary | ICD-10-CM | POA: Diagnosis not present

## 2023-04-27 DIAGNOSIS — R079 Chest pain, unspecified: Secondary | ICD-10-CM | POA: Diagnosis present

## 2023-04-27 DIAGNOSIS — I10 Essential (primary) hypertension: Secondary | ICD-10-CM | POA: Diagnosis not present

## 2023-04-27 DIAGNOSIS — Z79899 Other long term (current) drug therapy: Secondary | ICD-10-CM | POA: Diagnosis not present

## 2023-04-27 DIAGNOSIS — E119 Type 2 diabetes mellitus without complications: Secondary | ICD-10-CM | POA: Diagnosis not present

## 2023-04-27 LAB — COMPREHENSIVE METABOLIC PANEL
ALT: 34 U/L (ref 0–44)
AST: 23 U/L (ref 15–41)
Albumin: 4 g/dL (ref 3.5–5.0)
Alkaline Phosphatase: 60 U/L (ref 38–126)
Anion gap: 9 (ref 5–15)
BUN: 16 mg/dL (ref 6–20)
CO2: 22 mmol/L (ref 22–32)
Calcium: 8.9 mg/dL (ref 8.9–10.3)
Chloride: 106 mmol/L (ref 98–111)
Creatinine, Ser: 1.24 mg/dL (ref 0.61–1.24)
GFR, Estimated: >60 mL/min (ref >60)
Glucose, Bld: 121 mg/dL — ABNORMAL HIGH (ref 70–99)
Potassium: 4 mmol/L (ref 3.5–5.1)
Sodium: 137 mmol/L (ref 135–145)
Total Bilirubin: 1.3 mg/dL — ABNORMAL HIGH (ref 0.0–1.2)
Total Protein: 7.6 g/dL (ref 6.5–8.1)

## 2023-04-27 LAB — TROPONIN I (HIGH SENSITIVITY): Troponin I (High Sensitivity): 2 ng/L (ref <18)

## 2023-04-27 MED ORDER — KETOROLAC TROMETHAMINE 60 MG/2ML IM SOLN
30.0000 mg | Freq: Once | INTRAMUSCULAR | Status: AC
  Administered 2023-04-27: 30 mg via INTRAMUSCULAR
  Filled 2023-04-27: qty 2

## 2023-04-27 NOTE — ED Provider Notes (Signed)
 [place] EMERGENCY DEPARTMENT AT [facility] Provider Note  CSN: [id number] Arrival date & time: 04/26/23 1449  Chief Complaint(s) Chest Pain  HPI John Cisneros is a 43 y.o. male with a past medical history listed below who presents to the emergency department with 1 week of left upper chest/shoulder girdle region pain.  Described as sharp achy pain.  Constant and worse with range of motion of the left shoulder.  Pain is not exertional.  No shortness of breath.  No recent fevers or infections.  No coughing or congestion.  No nausea or vomiting.  No other physical complaints.  The history is provided by the patient.    Past Medical History Past Medical History:  Diagnosis Date   Diabetes mellitus without complication (HCC)    Hypertension    Kidney stone    Patient Active Problem List   Diagnosis Date Noted   Hemorrhoids 03/03/2021   Leukocytosis 06/27/2020   Type 2 diabetes mellitus with hyperglycemia, without long-term current use of insulin (HCC) 08/17/2017   Pityriasis rosea 01/19/2013   Proteinuria 05/22/2012   Seasonal allergies 05/22/2012   Kidney stone 03/01/2011   Microscopic hematuria 03/01/2011   Class 3 severe obesity due to excess calories with serious comorbidity and body mass index (BMI) of 40.0 to 44.9 in adult [facility] 03/15/2006   Essential hypertension 03/15/2006   Home Medication(s) Prior to Admission medications   Medication Sig Start Date End Date Taking? Authorizing Provider  amLODipine  (NORVASC ) 10 MG tablet TAKE 1 TABLET BY MOUTH EVERY DAY 03/28/23   Kayla Jeoffrey RAMAN, FNP  Blood Glucose Monitoring Suppl (BLOOD GLUCOSE SYSTEM PAK) KIT Please dispense based on patient and insurance preference. Use as directed to monitor FSBS up to 3x daily. Dx: E11.65. 08/17/17   Tapia, Leisa, PA-C  dapagliflozin  propanediol (FARXIGA ) 10 MG TABS tablet Take 10 mg by mouth daily.    [provider]  glucose blood test strip Use as instructed 03/04/21    Chandra Harlene LABOR, NP  Lancets MISC Please dispense based on patient and insurance preference. Use as directed to monitor FSBS up to 3x daily. Dx: E11.65. 08/17/17   Tapia, Leisa, PA-C  losartan  (COZAAR ) 100 MG tablet TAKE 1 TABLET BY MOUTH EVERY DAY 03/21/23   Howard, Amber S, FNP  Nebivolol  HCl 20 MG TABS TAKE 2 TABLETS (40 MG TOTAL) BY MOUTH DAILY. 11/15/22   Kayla Jeoffrey RAMAN, FNP                                                                                                                                    Allergies Lisinopril   Review of Systems Review of Systems As noted in HPI  Physical Exam Vital Signs  I have reviewed the triage vital signs BP (!) 156/117 (BP Location: Left Arm)   Pulse 73   Temp 98.5 F (36.9 C) (Oral)   Resp 16   Ht 6' 1 (  1.854 m)   Wt (!) 141.1 kg   SpO2 100%   BMI 41.04 kg/m   Physical Exam Vitals reviewed.  Constitutional:      General: He is not in acute distress.    Appearance: He is well-developed. He is not diaphoretic.  HENT:     Head: Normocephalic and atraumatic.     Right Ear: External ear normal.     Left Ear: External ear normal.     Nose: Nose normal.     Mouth/Throat:     Mouth: Mucous membranes are moist.  Eyes:     General: No scleral icterus.    Conjunctiva/sclera: Conjunctivae normal.  Neck:     Trachea: Phonation normal.  Cardiovascular:     Rate and Rhythm: Normal rate and regular rhythm.  Pulmonary:     Effort: Pulmonary effort is normal. No respiratory distress.     Breath sounds: No stridor.  Chest:     Chest wall: Tenderness present.    Abdominal:     General: There is no distension.  Musculoskeletal:        General: Normal range of motion.     Cervical back: Normal range of motion.  Neurological:     Mental Status: He is alert and oriented to person, place, and time.  Psychiatric:        Behavior: Behavior normal.     ED Results and Treatments Labs (all labs ordered are listed, but only abnormal  results are displayed) Labs Reviewed  CBC WITH DIFFERENTIAL/PLATELET - Abnormal; Notable for the following components:      Result Value   WBC 12.4 (*)    Neutro Abs 8.5 (*)    All other components within normal limits  COMPREHENSIVE METABOLIC PANEL - Abnormal; Notable for the following components:   Glucose, Bld 121 (*)    Total Bilirubin 1.3 (*)    All other components within normal limits  TROPONIN I (HIGH SENSITIVITY)  TROPONIN I (HIGH SENSITIVITY)                                                                                                                         EKG  EKG Interpretation Date/Time:  Tuesday April 26 2023 15:07:43 EST Ventricular Rate:  76 PR Interval:  152 QRS Duration:  101 QT Interval:  382 QTC Calculation: 430 R Axis:   36  Text Interpretation: Sinus rhythm Minimal ST depression, inferior leads Baseline wander in lead(s) V1 Confirmed by Trine Likes [phone]) on 04/27/2023 2:01:44 AM       Radiology DG Chest 2 View Result Date: 04/26/2023 CLINICAL DATA:  Chest pain EXAM: CHEST - 2 VIEW COMPARISON:  04/29/2015 and older FINDINGS: No consolidation, pneumothorax or effusion. No edema. Normal cardiopericardial silhouette. IMPRESSION: No acute cardiopulmonary disease. Electronically Signed   By: Ranell Bring M.D.   On: 04/26/2023 18:12    Medications Ordered in ED Medications  ketorolac  (TORADOL ) injection 30 mg (has no administration in time range)  Procedures Procedures  (including critical care time) Medical Decision Making / ED Course   Medical Decision Making Amount and/or Complexity of Data Reviewed Labs: ordered. Decision-making details documented in ED Course. Radiology: ordered and independent interpretation performed. Decision-making details documented in ED Course. ECG/medicine tests: ordered and independent interpretation performed. Decision-making details documented in ED Course.  Risk Prescription drug management.    Chest  pain differential considered listed below  EKG without acute ischemic changes, dysrhythmias or blocks.  Serial troponins negative x 2.  Chest pain is highly atypical for ACS.  Given duration of constant pain, feel this is sufficient to rule out out. Presentation not classic for dissection or esophageal perforation. Low suspicion for pulmonary embolism Chest x-ray without evidence of pneumonia, pneumothorax, pulmonary edema pleural effusions CBC with mild leukocytosis close to his baseline.  No anemia. Metabolic panel without significant electrolyte derangements or renal sufficiency.  Favoring MSK pain.  Supportive management recommended.    Final Clinical Impression(s) / ED Diagnoses Final diagnoses:  Chest wall pain   The patient appears reasonably screened and/or stabilized for discharge and I doubt any other medical condition or other [facility] requiring further screening, evaluation, or treatment in the ED at this time. I have discussed the findings, Dx and Tx plan with the patient/family who expressed understanding and agree(s) with the plan. Discharge instructions discussed at length. The patient/family was given strict return precautions who verbalized understanding of the instructions. No further questions at time of discharge.  Disposition: Discharge  Condition: Good  ED Discharge Orders     None        Follow Up: Kayla Jeoffrey RAMAN, FNP [address] [place] [address] [phone]  Call  to schedule an appointment for close follow up    This chart was dictated using voice recognition software.  Despite best efforts to proofread,  errors can occur which can change the documentation meaning.    Trine Raynell Moder, MD 04/27/23 [phone]

## 2023-04-27 NOTE — Discharge Instructions (Addendum)

## 2023-06-21 ENCOUNTER — Other Ambulatory Visit: Payer: Self-pay | Admitting: Family Medicine

## 2023-06-21 DIAGNOSIS — I10 Essential (primary) hypertension: Secondary | ICD-10-CM

## 2023-07-03 ENCOUNTER — Other Ambulatory Visit: Payer: Self-pay | Admitting: Family Medicine

## 2023-07-03 DIAGNOSIS — I10 Essential (primary) hypertension: Secondary | ICD-10-CM

## 2023-07-14 ENCOUNTER — Other Ambulatory Visit: Payer: Self-pay | Admitting: Family Medicine

## 2023-07-14 DIAGNOSIS — I10 Essential (primary) hypertension: Secondary | ICD-10-CM

## 2023-09-28 ENCOUNTER — Other Ambulatory Visit: Payer: Self-pay | Admitting: Family Medicine

## 2023-09-28 DIAGNOSIS — I10 Essential (primary) hypertension: Secondary | ICD-10-CM

## 2023-10-06 ENCOUNTER — Ambulatory Visit

## 2023-10-06 DIAGNOSIS — E1165 Type 2 diabetes mellitus with hyperglycemia: Secondary | ICD-10-CM | POA: Diagnosis not present

## 2023-10-06 LAB — HM DIABETES EYE EXAM

## 2023-10-06 NOTE — Progress Notes (Signed)
 John Cisneros arrived 10/06/2023 and has given verbal consent to obtain images and complete their overdue diabetic retinal screening.  The images have been sent to an ophthalmologist or optometrist for review and interpretation.  Results will be sent back to Jenelle Mis, FNP for review.  Patient has been informed they will be contacted when we receive the results via telephone or MyChart    Vallerie Gave, CMA

## 2023-10-17 NOTE — Progress Notes (Signed)
 Received results- No diabetic retinopathy shown. Routed exam to provider. Patient is aware of results and YES referral needed.

## 2023-10-21 ENCOUNTER — Other Ambulatory Visit: Payer: Self-pay | Admitting: Family Medicine

## 2023-10-21 DIAGNOSIS — I10 Essential (primary) hypertension: Secondary | ICD-10-CM

## 2023-10-27 ENCOUNTER — Ambulatory Visit

## 2023-12-23 ENCOUNTER — Other Ambulatory Visit: Payer: Self-pay | Admitting: Family Medicine

## 2023-12-23 DIAGNOSIS — I10 Essential (primary) hypertension: Secondary | ICD-10-CM

## 2024-01-11 ENCOUNTER — Encounter: Payer: Self-pay | Admitting: Family Medicine

## 2024-01-11 ENCOUNTER — Ambulatory Visit: Admitting: Family Medicine

## 2024-01-11 VITALS — BP 160/105 | HR 77 | Temp 97.8°F | Ht 73.0 in | Wt 328.4 lb

## 2024-01-11 DIAGNOSIS — Z0001 Encounter for general adult medical examination with abnormal findings: Secondary | ICD-10-CM

## 2024-01-11 DIAGNOSIS — I10 Essential (primary) hypertension: Secondary | ICD-10-CM | POA: Diagnosis not present

## 2024-01-11 DIAGNOSIS — Z0000 Encounter for general adult medical examination without abnormal findings: Secondary | ICD-10-CM | POA: Insufficient documentation

## 2024-01-11 DIAGNOSIS — E1165 Type 2 diabetes mellitus with hyperglycemia: Secondary | ICD-10-CM | POA: Diagnosis not present

## 2024-01-11 NOTE — Assessment & Plan Note (Signed)

## 2024-01-11 NOTE — Assessment & Plan Note (Addendum)
 A1c and uACR today. Foot exam due. Vaccines UTD. Retinal eye exam UTD. Recommend heart healthy diet such as Mediterranean diet with whole grains, fruits, vegetable, fish, lean meats, nuts, and olive oil. Limit salt. Encouraged moderate walking, 3-5 times/week for 30-50 minutes each session. Aim for at least 150 minutes.week. Goal should be pace of 3 miles/hours, or walking 1.5 miles in 30 minutes. Seek medical care for urinary frequency, extreme thirst, vision changes, lightheadedness, dizziness.  Start Mounjaro 2.5mg  weekly. Denies history of pancreatitis and personal or family history of MEN2 or MTC Counseled on importance of high protein diet and exercise.

## 2024-01-11 NOTE — Assessment & Plan Note (Signed)
 Uncontrolled on amlodipine  10mg  daily, losartan  100mg  daily, and nebivolol  40mg  daily. Discussed possible need for HTN clinic. He is adamant he does not want to see another doctor. Will monitor at home BID and follow up via video on Monday. Labs today include CMP, CBC, TSH, A1c, lipids. Recommend heart healthy diet such as Mediterranean diet with whole grains, fruits, vegetable, fish, lean meats, nuts, and olive oil. Limit salt. Encouraged moderate walking, 3-5 times/week for 30-50 minutes each session. Aim for at least 150 minutes.week. Goal should be pace of 3 miles/hours, or walking 1.5 miles in 30 minutes. Avoid tobacco products. Avoid excess alcohol. Take medications as prescribed and bring medications and blood pressure log with cuff to each office visit. Seek medical care for chest pain, palpitations, shortness of breath with exertion, dizziness/lightheadedness, vision changes, recurrent headaches, or swelling of extremities. Follow up Monday

## 2024-01-11 NOTE — Assessment & Plan Note (Signed)
 Counseled on importance of weight management for overall health. Encouraged low calorie, heart healthy diet and moderate intensity exercise 150 minutes weekly. This is 3-5 times weekly for 30-50 minutes each session. Goal should be pace of 3 miles/hours, or walking 1.5 miles in 30 minutes and include strength training. Discussed risks of obesity. Declined MWM, starting Mounjaro 2.5mg  weekly and increase as tolerated.

## 2024-01-11 NOTE — Progress Notes (Signed)
 Complete physical exam  Patient: John Cisneros   DOB: 27-May-1980   43 y.o. Male  MRN: [id number]  Subjective:    Chief Complaint  Patient presents with   Annual Exam    Blurry vision x 1 week     Christorpher T Xiao is a 43 y.o. male who presents today for a complete physical exam. He reports consuming a general diet. Gym/ health club routine includes treadmill. He generally feels well. He reports sleeping well. He does have additional problems to discuss today. Concerns include blurred vision or 1 week. He did have recent conjunctivitis and is treating with antibiotic ointment. He is attributing his symttoms to this. Is not monitoring his BP or BG at home. No eye pain or trauma/injury. Denies drainage. Has seen ophthalmology for this.   PMH includes HTN, DM2, and obesity.   HTN: uncontrolled on Amlodipine  10mg  daily, losartan  100mg  daily, Nebivolol  40mg  daily, not monitoring Denies chest pain, palpitations, recurrent headaches, vision changes, lightheadedness, dizziness, dyspnea on exertion, or swelling of extremities.  DM2: last A1c 5.7%, denies polyuria, polydypsia, chest pain, paresthesias, or chest pain, not monitoring  Obesity: BMI 43.33, interested in medication to help with weight loss, has tried diet and exercise, admits to poor diet currently, declines MWM    Most recent fall risk assessment:    01/11/2024    2:59 PM  Fall Risk   Falls in the past year? 0  Number falls in past yr: 0  Injury with Fall? 0  Risk for fall due to : No Fall Risks  Follow up Falls evaluation completed     Most recent depression screenings:    01/11/2024    2:59 PM 07/27/2022    3:38 PM  PHQ 2/9 Scores  PHQ - 2 Score 2 1  PHQ- 9 Score 5 2    Vision: and Dental:   Patient Active Problem List   Diagnosis Date Noted   Morbid obesity (HCC) 01/11/2024   Physical exam, annual 01/11/2024   Hemorrhoids 03/03/2021   Leukocytosis 06/27/2020   Type 2 diabetes mellitus with hyperglycemia,  without long-term current use of insulin (HCC) 08/17/2017   Pityriasis rosea 01/19/2013   Proteinuria 05/22/2012   Seasonal allergies 05/22/2012   Kidney stone 03/01/2011   Microscopic hematuria 03/01/2011   Class 3 severe obesity due to excess calories with serious comorbidity and body mass index (BMI) of 40.0 to 44.9 in adult 03/15/2006   Essential hypertension 03/15/2006   Past Medical History:  Diagnosis Date   Diabetes mellitus without complication (HCC)    Hypertension    Kidney stone    History reviewed. No pertinent surgical history. Social History   Tobacco Use   Smoking status: Former    Current packs/day: 0.50    Types: Cigarettes   Smokeless tobacco: Never  Vaping Use   Vaping status: Never Used  Substance Use Topics   Alcohol use: Yes    Comment: occasional   Drug use: No   Family History  Problem Relation Age of Onset   Hypertension Father    Heart disease Maternal Grandmother    Heart disease Maternal Grandfather    Heart disease Paternal Grandmother    Heart disease Paternal Grandfather    Allergies  Allergen Reactions   Lisinopril  Swelling    Lip swelling. angioedema      Patient Care Team: Kayla Jeoffrey RAMAN, FNP as PCP - General (Family Medicine)   Outpatient Medications Prior to Visit  Medication Sig  amLODipine  (NORVASC ) 10 MG tablet TAKE 1 TABLET BY MOUTH EVERY DAY   dapagliflozin propanediol (FARXIGA) 10 MG TABS tablet Take 10 mg by mouth daily.   losartan  (COZAAR ) 100 MG tablet TAKE 1 TABLET BY MOUTH EVERY DAY   Nebivolol  HCl 20 MG TABS TAKE 2 TABLETS (40 MG TOTAL) BY MOUTH DAILY.   Blood Glucose Monitoring Suppl (BLOOD GLUCOSE SYSTEM PAK) KIT Please dispense based on patient and insurance preference. Use as directed to monitor FSBS up to 3x daily. Dx: E11.65. (Patient not taking: Reported on 01/11/2024)   glucose blood test strip Use as instructed (Patient not taking: Reported on 01/11/2024)   Lancets MISC Please dispense based on patient  and insurance preference. Use as directed to monitor FSBS up to 3x daily. Dx: E11.65. (Patient not taking: Reported on 01/11/2024)   No facility-administered medications prior to visit.    Review of Systems  Constitutional: Negative.   HENT: Negative.    Eyes:  Positive for blurred vision.  Respiratory: Negative.    Cardiovascular: Negative.   Gastrointestinal: Negative.   Genitourinary: Negative.   Musculoskeletal: Negative.   Skin: Negative.   Neurological: Negative.   Endo/Heme/Allergies: Negative.   Psychiatric/Behavioral: Negative.    All other systems reviewed and are negative.         Objective:     BP (!) 160/105 (BP Location: Left Arm, Patient Position: Sitting, Cuff Size: Large)   Pulse 77   Temp 97.8 F (36.6 C)   Ht 6' 1 (1.854 m)   Wt (!) 328 lb 6.4 oz (149 kg)   SpO2 95%   BMI 43.33 kg/m  BP Readings from Last 3 Encounters:  01/11/24 (!) 160/105  04/27/23 139/78  07/27/22 (!) 146/102   Wt Readings from Last 3 Encounters:  01/11/24 (!) 328 lb 6.4 oz (149 kg)  04/26/23 (!) 311 lb 1.1 oz (141.1 kg)  07/27/22 (!) 311 lb (141.1 kg)      Physical Exam Vitals and nursing note reviewed.  Constitutional:      Appearance: Normal appearance. He is obese.  HENT:     Head: Normocephalic and atraumatic.     Right Ear: Tympanic membrane, ear canal and external ear normal.     Left Ear: Tympanic membrane, ear canal and external ear normal.     Nose: Nose normal.     Mouth/Throat:     Mouth: Mucous membranes are moist.     Pharynx: Oropharynx is clear.  Eyes:     Extraocular Movements: Extraocular movements intact.     Right eye: Normal extraocular motion and no nystagmus.     Left eye: Normal extraocular motion and no nystagmus.     Conjunctiva/sclera: Conjunctivae normal.     Pupils: Pupils are equal, round, and reactive to light.  Cardiovascular:     Rate and Rhythm: Normal rate and regular rhythm.     Pulses: Normal pulses.     Heart sounds:  Normal heart sounds.  Pulmonary:     Effort: Pulmonary effort is normal.     Breath sounds: Normal breath sounds.  Abdominal:     General: Bowel sounds are normal.     Palpations: Abdomen is soft.  Genitourinary:    Comments: Deferred using shared decision making Musculoskeletal:        General: Normal range of motion.     Cervical back: Normal range of motion and neck supple.  Skin:    General: Skin is warm and dry.     Capillary  Refill: Capillary refill takes less than 2 seconds.  Neurological:     General: No focal deficit present.     Mental Status: He is alert. Mental status is at baseline.  Psychiatric:        Mood and Affect: Mood normal.        Speech: Speech normal.        Behavior: Behavior normal.        Thought Content: Thought content normal.        Cognition and Memory: Cognition and memory normal.        Judgment: Judgment normal.      No results found for any visits on 01/11/24.     Assessment & Plan:    Routine Health Maintenance and Physical Exam  Immunization History  Administered Date(s) Administered   Hepatitis B 03/03/1994, 03/31/1994, 09/15/1994   Pneumococcal Polysaccharide-23 11/14/2017   Tdap 01/28/2015    Health Maintenance  Topic Date Due   HPV VACCINES (1 - 3-dose SCDM series) Never done   FOOT EXAM  09/01/2018   Pneumococcal Vaccine (2 of 2 - PCV) 11/15/2018   HEMOGLOBIN A1C  07/27/2022   Diabetic kidney evaluation - Urine ACR  01/26/2023   COVID-19 Vaccine (1 - 2024-25 season) 01/27/2024 (Originally 12/26/2023)   Influenza Vaccine  02/08/2024 (Originally 11/25/2023)   Diabetic kidney evaluation - eGFR measurement  04/26/2024   OPHTHALMOLOGY EXAM  10/05/2024   DTaP/Tdap/Td (2 - Td or Tdap) 01/27/2025   Hepatitis B Vaccines 19-59 Average Risk  Completed   Hepatitis C Screening  Completed   Meningococcal B Vaccine  Aged Out   HIV Screening  Discontinued    Discussed health benefits of physical activity, and encouraged him to engage  in regular exercise appropriate for his age and condition.  Problem List Items Addressed This Visit     Essential hypertension   Uncontrolled on amlodipine  10mg  daily, losartan  100mg  daily, and nebivolol  40mg  daily. Discussed possible need for HTN clinic. He is adamant he does not want to see another doctor. Will monitor at home BID and follow up via video on Monday. Labs today include CMP, CBC, TSH, A1c, lipids. Recommend heart healthy diet such as Mediterranean diet with whole grains, fruits, vegetable, fish, lean meats, nuts, and olive oil. Limit salt. Encouraged moderate walking, 3-5 times/week for 30-50 minutes each session. Aim for at least 150 minutes.week. Goal should be pace of 3 miles/hours, or walking 1.5 miles in 30 minutes. Avoid tobacco products. Avoid excess alcohol. Take medications as prescribed and bring medications and blood pressure log with cuff to each office visit. Seek medical care for chest pain, palpitations, shortness of breath with exertion, dizziness/lightheadedness, vision changes, recurrent headaches, or swelling of extremities. Follow up Monday      Relevant Orders   CBC with Differential/Platelet   Comprehensive metabolic panel with GFR   Lipid panel   Hemoglobin A1c   TSH   Microalbumin / creatinine urine ratio   Type 2 diabetes mellitus with hyperglycemia, without long-term current use of insulin (HCC)   A1c and uACR today. Foot exam due. Vaccines UTD. Retinal eye exam UTD. Recommend heart healthy diet such as Mediterranean diet with whole grains, fruits, vegetable, fish, lean meats, nuts, and olive oil. Limit salt. Encouraged moderate walking, 3-5 times/week for 30-50 minutes each session. Aim for at least 150 minutes.week. Goal should be pace of 3 miles/hours, or walking 1.5 miles in 30 minutes. Seek medical care for urinary frequency, extreme thirst, vision changes, lightheadedness, dizziness.  Start Mounjaro 2.5mg  weekly. Denies history of pancreatitis and  personal or family history of MEN2 or MTC Counseled on importance of high protein diet and exercise.       Relevant Orders   CBC with Differential/Platelet   Comprehensive metabolic panel with GFR   Lipid panel   Hemoglobin A1c   TSH   Microalbumin / creatinine urine ratio   Morbid obesity (HCC)   Counseled on importance of weight management for overall health. Encouraged low calorie, heart healthy diet and moderate intensity exercise 150 minutes weekly. This is 3-5 times weekly for 30-50 minutes each session. Goal should be pace of 3 miles/hours, or walking 1.5 miles in 30 minutes and include strength training. Discussed risks of obesity. Declined MWM, starting Mounjaro 2.5mg  weekly and increase as tolerated.       Relevant Orders   CBC with Differential/Platelet   Comprehensive metabolic panel with GFR   Lipid panel   Hemoglobin A1c   TSH   Microalbumin / creatinine urine ratio   Physical exam, annual - Primary   Today your medical history was reviewed and routine physical exam with labs was performed. Recommend 150 minutes of moderate intensity exercise weekly and consuming a well-balanced diet. Advised to stop smoking if a smoker, avoid smoking if a non-smoker, limit alcohol consumption to 1 drink per day for women and 2 drinks per day for men, and avoid illicit drug use. Counseled on safe sex practices and offered STI testing today. Counseled on the importance of sunscreen use. Counseled in mental health awareness and when to seek medical care. Vaccine maintenance discussed. Appropriate health maintenance items reviewed. Return to office in 1 year for annual physical exam.       Relevant Orders   CBC with Differential/Platelet   Comprehensive metabolic panel with GFR   Lipid panel   Hemoglobin A1c   TSH   Microalbumin / creatinine urine ratio   Return for follow-up video visit next week to discuss labs and home BP.     Jeoffrey GORMAN Barrio, FNP

## 2024-01-12 ENCOUNTER — Ambulatory Visit: Payer: Self-pay | Admitting: Family Medicine

## 2024-01-12 LAB — CBC WITH DIFFERENTIAL/PLATELET
Absolute Lymphocytes: 2247 {cells}/uL (ref 850–3900)
Absolute Monocytes: 1008 {cells}/uL — ABNORMAL HIGH (ref 200–950)
Basophils Absolute: 53 {cells}/uL (ref 0–200)
Basophils Relative: 0.5 %
Eosinophils Absolute: 284 {cells}/uL (ref 15–500)
Eosinophils Relative: 2.7 %
HCT: 48 % (ref 38.5–50.0)
Hemoglobin: 15.9 g/dL (ref 13.2–17.1)
MCH: 30.1 pg (ref 27.0–33.0)
MCHC: 33.1 g/dL (ref 32.0–36.0)
MCV: 90.9 fL (ref 80.0–100.0)
MPV: 10.7 fL (ref 7.5–12.5)
Monocytes Relative: 9.6 %
Neutro Abs: 6909 {cells}/uL (ref 1500–7800)
Neutrophils Relative %: 65.8 %
Platelets: 274 Thousand/uL (ref 140–400)
RBC: 5.28 Million/uL (ref 4.20–5.80)
RDW: 12.2 % (ref 11.0–15.0)
Total Lymphocyte: 21.4 %
WBC: 10.5 Thousand/uL (ref 3.8–10.8)

## 2024-01-12 LAB — COMPREHENSIVE METABOLIC PANEL WITH GFR
AG Ratio: 1.6 (calc) (ref 1.0–2.5)
ALT: 36 U/L (ref 9–46)
AST: 28 U/L (ref 10–40)
Albumin: 4.4 g/dL (ref 3.6–5.1)
Alkaline phosphatase (APISO): 73 U/L (ref 36–130)
BUN: 21 mg/dL (ref 7–25)
CO2: 27 mmol/L (ref 20–32)
Calcium: 9.2 mg/dL (ref 8.6–10.3)
Chloride: 103 mmol/L (ref 98–110)
Creat: 1.19 mg/dL (ref 0.60–1.29)
Globulin: 2.7 g/dL (ref 1.9–3.7)
Glucose, Bld: 98 mg/dL (ref 65–99)
Potassium: 4.2 mmol/L (ref 3.5–5.3)
Sodium: 138 mmol/L (ref 135–146)
Total Bilirubin: 0.5 mg/dL (ref 0.2–1.2)
Total Protein: 7.1 g/dL (ref 6.1–8.1)
eGFR: 79 mL/min/1.73m2 (ref >=60)

## 2024-01-12 LAB — LIPID PANEL
Cholesterol: 109 mg/dL (ref <200)
HDL: 36 mg/dL — ABNORMAL LOW (ref >=40)
LDL Cholesterol (Calc): 46 mg/dL
Non-HDL Cholesterol (Calc): 73 mg/dL (ref <130)
Total CHOL/HDL Ratio: 3 (calc) (ref <5.0)
Triglycerides: 195 mg/dL — ABNORMAL HIGH (ref <150)

## 2024-01-12 LAB — HEMOGLOBIN A1C
Hgb A1c MFr Bld: 5.8 % — ABNORMAL HIGH (ref <5.7)
Mean Plasma Glucose: 120 mg/dL
eAG (mmol/L): 6.6 mmol/L

## 2024-01-12 LAB — MICROALBUMIN / CREATININE URINE RATIO
Creatinine, Urine: 88 mg/dL (ref 20–320)
Microalb Creat Ratio: 491 mg/g{creat} — ABNORMAL HIGH (ref <30)
Microalb, Ur: 43.2 mg/dL

## 2024-01-12 LAB — TSH: TSH: 0.71 m[IU]/L (ref 0.40–4.50)

## 2024-01-19 ENCOUNTER — Telehealth: Admitting: Family Medicine

## 2024-01-19 ENCOUNTER — Encounter: Payer: Self-pay | Admitting: Family Medicine

## 2024-01-19 DIAGNOSIS — E1165 Type 2 diabetes mellitus with hyperglycemia: Secondary | ICD-10-CM

## 2024-01-19 DIAGNOSIS — R801 Persistent proteinuria, unspecified: Secondary | ICD-10-CM

## 2024-01-19 DIAGNOSIS — Z7985 Long-term (current) use of injectable non-insulin antidiabetic drugs: Secondary | ICD-10-CM

## 2024-01-19 DIAGNOSIS — I10 Essential (primary) hypertension: Secondary | ICD-10-CM | POA: Diagnosis not present

## 2024-01-19 MED ORDER — LABETALOL HCL 100 MG PO TABS: 100.0000 mg | ORAL_TABLET | Freq: Two times a day (BID) | ORAL | 1 refills | Status: DC

## 2024-01-19 MED ORDER — TIRZEPATIDE 5 MG/0.5ML ~~LOC~~ SOAJ: 5.0000 mg | SUBCUTANEOUS | 0 refills | Status: DC

## 2024-01-19 NOTE — Assessment & Plan Note (Addendum)
 Counseled on importance of weight management for overall health. Encouraged low calorie, heart healthy diet and moderate intensity exercise 150 minutes weekly. This is 3-5 times weekly for 30-50 minutes each session. Goal should be pace of 3 miles/hours, or walking 1.5 miles in 30 minutes and include strength training. Discussed risks of obesity. Declined MWM, continue Mounjaro  2.5mg  weekly and increase as tolerated.

## 2024-01-19 NOTE — Progress Notes (Signed)
 Virtual Visit via Video note  I connected with John Cisneros on 01/19/24 at 1400 by video and verified that I am speaking with the correct person using two identifiers. John Cisneros is currently located at work and no one is currently with him during visit. The provider, Jeoffrey GORMAN Barrio, FNP is located in their office at time of visit.  I discussed the limitations, risks, security and privacy concerns of performing an evaluation and management service by video and the availability of in person appointments. I also discussed with the patient that there may be a patient responsible charge related to this service. The patient expressed understanding and agreed to proceed.  Subjective: PCP: Barrio Jeoffrey GORMAN, FNP  No chief complaint on file.   HPI John Cisneros is connecting today to follow up on his blood pressure and recent labs. Home BP readings have been 154/109, 145/97, and 135/92 on Amlodipine  10mg  daily, losartan  100mg  daily, and nebivolol  40mg  daily. Labs stable. He has been working very hard on diet and exercise and has actually lost 8 pounds in the last 2 weeks. He was also started on Mounjaro , was provided with 2.5mg  sample pack in office, he is tolerating this well. Previous A1c was 7.6, most current 5.8%. Discussed persistent proteinuria, is on ARB, followed by Nephrology for this.  Denies chest pain, palpitations, recurrent headaches, vision changes, lightheadedness, dizziness, dyspnea on exertion, or swelling of extremities.   Lipid Panel     Component Value Date/Time   CHOL 109 01/11/2024 1530   TRIG 195 (H) 01/11/2024 1530   HDL 36 (L) 01/11/2024 1530   CHOLHDL 3.0 01/11/2024 1530   VLDL 17 01/28/2015 1028   LDLCALC 46 01/11/2024 1530   Lab Results  Component Value Date   HGBA1C 5.8 (H) 01/11/2024   HGBA1C 5.7 (H) 01/25/2022   HGBA1C 7.6 (H) 03/02/2021      ROS: Per HPI  Current Outpatient Medications:    labetalol  (NORMODYNE ) 100 MG tablet, Take 1 tablet (100  mg total) by mouth 2 (two) times daily., Disp: 180 tablet, Rfl: 1   tirzepatide  (MOUNJARO ) 5 MG/0.5ML Pen, Inject 5 mg into the skin once a week., Disp: 6 mL, Rfl: 0   amLODipine  (NORVASC ) 10 MG tablet, TAKE 1 TABLET BY MOUTH EVERY DAY, Disp: 90 tablet, Rfl: 0   Blood Glucose Monitoring Suppl (BLOOD GLUCOSE SYSTEM PAK) KIT, Please dispense based on patient and insurance preference. Use as directed to monitor FSBS up to 3x daily. Dx: E11.65. (Patient not taking: Reported on 01/11/2024), Disp: 1 each, Rfl: 1   dapagliflozin propanediol (FARXIGA) 10 MG TABS tablet, Take 10 mg by mouth daily., Disp: , Rfl:    glucose blood test strip, Use as instructed (Patient not taking: Reported on 01/11/2024), Disp: 100 each, Rfl: 12   Lancets MISC, Please dispense based on patient and insurance preference. Use as directed to monitor FSBS up to 3x daily. Dx: E11.65. (Patient not taking: Reported on 01/11/2024), Disp: 100 each, Rfl: 1   losartan  (COZAAR ) 100 MG tablet, TAKE 1 TABLET BY MOUTH EVERY DAY, Disp: 90 tablet, Rfl: 1  Observations/Objective: Physical Exam Constitutional:      Appearance: Normal appearance.  Pulmonary:     Effort: No respiratory distress.  Neurological:     General: No focal deficit present.     Mental Status: He is alert and oriented to person, place, and time.  Psychiatric:        Mood and Affect: Mood normal.  Behavior: Behavior normal.        Thought Content: Thought content normal.        Judgment: Judgment normal.    Assessment and Plan: Essential hypertension Assessment & Plan: Uncontrolled on amlodipine  10mg  daily, losartan  100mg  daily, and nebivolol  40mg  daily. Stop Nebivolol  and start Labetalol  100mg  BID. Referral to HTN clinic. Continue to monitor at home BID and follow up in 4 weeks.   Recommend heart healthy diet such as Mediterranean diet with whole grains, fruits, vegetable, fish, lean meats, nuts, and olive oil. Limit salt. Encouraged moderate walking, 3-5  times/week for 30-50 minutes each session. Aim for at least 150 minutes.week. Goal should be pace of 3 miles/hours, or walking 1.5 miles in 30 minutes. Avoid tobacco products. Avoid excess alcohol. Take medications as prescribed and bring medications and blood pressure log with cuff to each office visit. Seek medical care for chest pain, palpitations, shortness of breath with exertion, dizziness/lightheadedness, vision changes, recurrent headaches, or swelling of extremities.   Orders: -     Ambulatory referral to [facility]  Type 2 diabetes mellitus with hyperglycemia, without long-term current use of insulin (HCC) Assessment & Plan: Continue mounjaro  sample box 2.5mg  weekly. Increase to 5mg  weekly after.    Persistent proteinuria  Morbid obesity (HCC) Assessment & Plan: Counseled on importance of weight management for overall health. Encouraged low calorie, heart healthy diet and moderate intensity exercise 150 minutes weekly. This is 3-5 times weekly for 30-50 minutes each session. Goal should be pace of 3 miles/hours, or walking 1.5 miles in 30 minutes and include strength training. Discussed risks of obesity. Declined MWM, continue Mounjaro  2.5mg  weekly and increase as tolerated.    Other orders -     Labetalol  HCl; Take 1 tablet (100 mg total) by mouth 2 (two) times daily.  Dispense: 180 tablet; Refill: 1 -     Tirzepatide ; Inject 5 mg into the skin once a week.  Dispense: 6 mL; Refill: 0    Follow Up Instructions: Return in about 4 weeks (around 02/16/2024) for hypertension.   I discussed the assessment and treatment plan with the patient. The patient was provided an opportunity to ask questions and all were answered. The patient agreed with the plan and demonstrated an understanding of the instructions.   The patient was advised to call back or seek an in-person evaluation if the symptoms worsen or if the condition fails to improve as anticipated.  The above  assessment and management plan was discussed with the patient. The patient verbalized understanding of and has agreed to the management plan. Patient is aware to call the clinic if symptoms persist or worsen. Patient is aware when to return to the clinic for a follow-up visit. Patient educated on when it is appropriate to go to the emergency department.   Time call ended: 1411  I provided 11 minutes of face-to-face time during this encounter.   Jeoffrey Barrio, MSN, APRN, FNP-C [facility]

## 2024-01-19 NOTE — Assessment & Plan Note (Signed)
 Uncontrolled on amlodipine  10mg  daily, losartan  100mg  daily, and nebivolol  40mg  daily. Stop Nebivolol  and start Labetalol  100mg  BID. Referral to HTN clinic. Continue to monitor at home BID and follow up in 4 weeks.   Recommend heart healthy diet such as Mediterranean diet with whole grains, fruits, vegetable, fish, lean meats, nuts, and olive oil. Limit salt. Encouraged moderate walking, 3-5 times/week for 30-50 minutes each session. Aim for at least 150 minutes.week. Goal should be pace of 3 miles/hours, or walking 1.5 miles in 30 minutes. Avoid tobacco products. Avoid excess alcohol. Take medications as prescribed and bring medications and blood pressure log with cuff to each office visit. Seek medical care for chest pain, palpitations, shortness of breath with exertion, dizziness/lightheadedness, vision changes, recurrent headaches, or swelling of extremities.

## 2024-01-19 NOTE — Assessment & Plan Note (Signed)
 Continue mounjaro  sample box 2.5mg  weekly. Increase to 5mg  weekly after.

## 2024-01-24 NOTE — Addendum Note (Signed)
 Addended by: KAYLA JEOFFREY RAMAN on: 01/24/2024 10:32 AM   Modules accepted: Level of Service

## 2024-02-07 ENCOUNTER — Telehealth: Payer: Self-pay

## 2024-02-07 ENCOUNTER — Other Ambulatory Visit (HOSPITAL_COMMUNITY): Payer: Self-pay

## 2024-02-07 NOTE — Telephone Encounter (Signed)
 Reached out to patient assistance for application information.

## 2024-02-07 NOTE — Telephone Encounter (Signed)
 Patient assistance has responded with need for a RX PA. PA request for Mounjaro  was sent stating that pt. Has DX of Type 2 diabetes mellitus with hyperglycemia, without long-term current use of insulin (HCC) , HTN and Morbid Obesity as supporting DX for medication approval.

## 2024-02-07 NOTE — Telephone Encounter (Signed)
 PA request has been Started.

## 2024-02-09 ENCOUNTER — Other Ambulatory Visit: Payer: Self-pay

## 2024-02-09 ENCOUNTER — Telehealth: Payer: Self-pay

## 2024-02-09 ENCOUNTER — Other Ambulatory Visit: Payer: Self-pay | Admitting: Family Medicine

## 2024-02-09 ENCOUNTER — Other Ambulatory Visit (HOSPITAL_COMMUNITY): Payer: Self-pay

## 2024-02-09 MED ORDER — TIRZEPATIDE 7.5 MG/0.5ML ~~LOC~~ SOAJ
7.5000 mg | SUBCUTANEOUS | 0 refills | Status: DC
  Filled 2024-02-09 – 2024-02-13 (×4): qty 2, 28d supply, fill #0

## 2024-02-09 MED ORDER — TIRZEPATIDE 7.5 MG/0.5ML ~~LOC~~ SOAJ: 7.5000 mg | SUBCUTANEOUS | 0 refills | Status: DC

## 2024-02-09 NOTE — Telephone Encounter (Signed)
 Pharmacy Patient Advocate Encounter  Insurance verification completed.   The patient is insured through [facility] ADVANTAGE/RX ADVANCE   Ran test claim for Mounjaro  5 mg/0.27ml. Currently a quantity of 2 ml is a 28 day supply and the co-pay is $50.00 . The current 30 day supply day co-pay is, $50.00.  No PA needed at this time.  This test claim was processed through [facility]- copay amounts may vary at other pharmacies due to pharmacy/plan contracts, or as the patient moves through the different stages of their insurance plan.

## 2024-02-10 ENCOUNTER — Other Ambulatory Visit (HOSPITAL_COMMUNITY): Payer: Self-pay

## 2024-02-10 ENCOUNTER — Other Ambulatory Visit: Payer: Self-pay

## 2024-02-10 NOTE — Telephone Encounter (Signed)
 [facility] On [address]. Pt. Will update if he has any issues with pick up

## 2024-02-13 ENCOUNTER — Other Ambulatory Visit: Payer: Self-pay

## 2024-02-14 ENCOUNTER — Other Ambulatory Visit: Payer: Self-pay

## 2024-02-14 MED ORDER — TIRZEPATIDE 7.5 MG/0.5ML ~~LOC~~ SOAJ
7.5000 mg | SUBCUTANEOUS | 0 refills | Status: DC
  Filled 2024-02-14 – 2024-03-09 (×2): qty 2, 28d supply, fill #0
  Filled 2024-04-09: qty 2, 28d supply, fill #1

## 2024-02-14 MED ORDER — DAPAGLIFLOZIN PROPANEDIOL 10 MG PO TABS
10.0000 mg | ORAL_TABLET | Freq: Every day | ORAL | 2 refills | Status: DC
  Filled 2024-02-14: qty 30, 30d supply, fill #0
  Filled 2024-03-09: qty 90, 90d supply, fill #1
  Filled 2024-03-09: qty 30, 30d supply, fill #1

## 2024-02-14 MED FILL — Losartan Potassium Tab 100 MG: ORAL | 30 days supply | Qty: 30 | Fill #0 | Status: CN

## 2024-02-15 ENCOUNTER — Other Ambulatory Visit: Payer: Self-pay

## 2024-03-09 ENCOUNTER — Other Ambulatory Visit: Payer: Self-pay

## 2024-03-12 ENCOUNTER — Other Ambulatory Visit: Payer: Self-pay

## 2024-03-14 ENCOUNTER — Other Ambulatory Visit: Payer: Self-pay

## 2024-03-23 ENCOUNTER — Other Ambulatory Visit: Payer: Self-pay | Admitting: Family Medicine

## 2024-03-23 DIAGNOSIS — I10 Essential (primary) hypertension: Secondary | ICD-10-CM

## 2024-03-28 ENCOUNTER — Ambulatory Visit: Admitting: Family Medicine

## 2024-04-02 ENCOUNTER — Telehealth: Payer: Self-pay | Admitting: Family Medicine

## 2024-04-02 DIAGNOSIS — I10 Essential (primary) hypertension: Secondary | ICD-10-CM

## 2024-04-02 NOTE — Telephone Encounter (Unsigned)
 Copied from CRM #[id number]. Topic: Clinical - Medication Refill >> Apr 02, 2024  2:00 PM Ameerah G wrote: Medication: amLODipine  (NORVASC ) 10 MG tablet [[id number]  Has the patient contacted their pharmacy? Yes (Agent: If no, request that the patient contact the pharmacy for the refill. If patient does not wish to contact the pharmacy document the reason why and proceed with request.) (Agent: If yes, when and what did the pharmacy advise?)  This is the patient's preferred pharmacy:  [phone] [address], [place] - [address] [place] [address] Phone: [phone] Fax: [phone]   Is this the correct pharmacy for this prescription? Yes If no, delete pharmacy and type the correct one.   Has the prescription been filled recently? No  Is the patient out of the medication? Yes  Has the patient been seen for an appointment in the last year OR does the patient have an upcoming appointment? Yes  Can we respond through MyChart? Yes  Agent: Please be advised that Rx refills may take up to 3 business days. We ask that you follow-up with your pharmacy.   ----------------------------------------------------------------------- From previous Reason for Contact - Lab/Test Results: Reason for CRM:

## 2024-04-03 ENCOUNTER — Other Ambulatory Visit: Payer: Self-pay | Admitting: Family Medicine

## 2024-04-03 DIAGNOSIS — I10 Essential (primary) hypertension: Secondary | ICD-10-CM

## 2024-04-04 NOTE — Telephone Encounter (Signed)
 Duplicate request- filled 04/03/24 Requested Prescriptions  Pending Prescriptions Disp Refills   amLODipine  (NORVASC ) 10 MG tablet 90 tablet 0    Sig: Take 1 tablet (10 mg total) by mouth daily.     Cardiovascular: Calcium Channel Blockers 2 Failed - 04/04/2024  2:04 PM      Failed - Last BP in normal range    BP Readings from Last 1 Encounters:  01/11/24 (!) 160/105         Passed - Last Heart Rate in normal range    Pulse Readings from Last 1 Encounters:  01/11/24 77         Passed - Valid encounter within last 6 months    Recent Outpatient Visits           2 months ago Essential hypertension   [place] [facility] Kayla Jeoffrey RAMAN, FNP   2 months ago Physical exam, annual   [place] [facility] Kayla Jeoffrey RAMAN, FNP   1 year ago Essential hypertension   [place] [facility] Kayla Jeoffrey RAMAN, FNP   1 year ago Essential hypertension   [place] [facility] Kayla Jeoffrey RAMAN, FNP   2 years ago Class 3 severe obesity due to excess calories with serious comorbidity and body mass index (BMI) of 40.0 to 44.9 in adult [facility])   [place] [facility] Kayla Jeoffrey RAMAN, [place]

## 2024-04-09 ENCOUNTER — Other Ambulatory Visit: Payer: Self-pay

## 2024-04-17 ENCOUNTER — Other Ambulatory Visit: Payer: Self-pay | Admitting: Family Medicine

## 2024-04-17 ENCOUNTER — Telehealth: Payer: Self-pay | Admitting: Family Medicine

## 2024-04-17 NOTE — Telephone Encounter (Signed)
 Copied from CRM #[id number]. Topic: Clinical - Medication Refill >> Apr 17, 2024  1:39 PM Amy B wrote: Medication:  labetalol  (NORMODYNE ) 100 MG tablet  Has the patient contacted their pharmacy? Yes (Agent: If no, request that the patient contact the pharmacy for the refill. If patient does not wish to contact the pharmacy document the reason why and proceed with request.) (Agent: If yes, when and what did the pharmacy advise?)  This is the patient's preferred pharmacy:  [phone] [address], [place] - [address] [place] [address] Phone: [phone] Fax: [phone]  Is this the correct pharmacy for this prescription? Yes If no, delete pharmacy and type the correct one.   Has the prescription been filled recently? No  Is the patient out of the medication? Yes  Has the patient been seen for an appointment in the last year OR does the patient have an upcoming appointment? Yes  Can we respond through MyChart? Yes  Agent: Please be advised that Rx refills may take up to 3 business days. We ask that you follow-up with your pharmacy.

## 2024-04-18 ENCOUNTER — Other Ambulatory Visit: Payer: Self-pay | Admitting: Family Medicine

## 2024-04-18 NOTE — Telephone Encounter (Signed)
 Rx 01/19/24 #180 1RF- too soon Requested Prescriptions  Pending Prescriptions Disp Refills   labetalol  (NORMODYNE ) 100 MG tablet [Pharmacy Med Name: LABETALOL  HCL 100 MG TABLET] 180 tablet 1    Sig: TAKE 1 TABLET BY MOUTH TWICE A DAY     Cardiovascular:  Beta Blockers Failed - 04/18/2024  3:03 PM      Failed - Last BP in normal range    BP Readings from Last 1 Encounters:  01/11/24 (!) 160/105         Passed - Last Heart Rate in normal range    Pulse Readings from Last 1 Encounters:  01/11/24 77         Passed - Valid encounter within last 6 months    Recent Outpatient Visits           3 months ago Essential hypertension   [place] [facility] Kayla Jeoffrey RAMAN, FNP   3 months ago Physical exam, annual   [place] [facility] Kayla Jeoffrey RAMAN, FNP   1 year ago Essential hypertension   [place] [facility] Kayla Jeoffrey RAMAN, FNP   1 year ago Essential hypertension   [place] [facility] Kayla Jeoffrey RAMAN, FNP   2 years ago Class 3 severe obesity due to excess calories with serious comorbidity and body mass index (BMI) of 40.0 to 44.9 in adult [facility])   [place] [facility] Kayla Jeoffrey RAMAN, [place]

## 2024-04-20 ENCOUNTER — Other Ambulatory Visit: Payer: Self-pay

## 2024-04-24 ENCOUNTER — Ambulatory Visit: Admitting: Family Medicine

## 2024-04-24 ENCOUNTER — Other Ambulatory Visit: Payer: Self-pay

## 2024-04-24 ENCOUNTER — Encounter: Payer: Self-pay | Admitting: Family Medicine

## 2024-04-24 VITALS — BP 137/93 | HR 95 | Ht 73.0 in | Wt 305.4 lb

## 2024-04-24 DIAGNOSIS — Z7985 Long-term (current) use of injectable non-insulin antidiabetic drugs: Secondary | ICD-10-CM | POA: Diagnosis not present

## 2024-04-24 DIAGNOSIS — E1165 Type 2 diabetes mellitus with hyperglycemia: Secondary | ICD-10-CM | POA: Diagnosis not present

## 2024-04-24 DIAGNOSIS — I10 Essential (primary) hypertension: Secondary | ICD-10-CM | POA: Diagnosis not present

## 2024-04-24 DIAGNOSIS — Z6841 Body Mass Index (BMI) 40.0 and over, adult: Secondary | ICD-10-CM | POA: Diagnosis not present

## 2024-04-24 MED ORDER — TIRZEPATIDE 10 MG/0.5ML ~~LOC~~ SOAJ
10.0000 mg | SUBCUTANEOUS | 0 refills | Status: AC
  Filled 2024-04-24 – 2024-05-07 (×2): qty 2, 28d supply, fill #0
  Filled 2024-05-30: qty 2, 28d supply, fill #1

## 2024-04-24 NOTE — Progress Notes (Signed)
 "  Acute Office Visit  Patient ID: John Cisneros, male    DOB: 04-29-1980, 43 y.o.   MRN: [id number]  PCP: Kayla John RAMAN, FNP  Chief Complaint  Patient presents with   Medical Management of Chronic Issues    Wt check in     Subjective:     HPI  Discussed the use of AI scribe software for clinical note transcription with the patient, who gave verbal consent to proceed.  History of Present Illness John Cisneros is a 42 year old male with hypertension who presents for follow-up on blood pressure management and weight loss.  He has experienced significant weight loss of approximately thirty pounds, attributed to an improved diet and increased physical activity, including going to the gym five times a week. He feels better overall, with improved sleep and no significant changes in appetite despite being on Mounjaro  7.5 mg. He consumes three meals a day, with minimal snacking, and ensures adequate protein intake, primarily from chicken.  He is currently on three antihypertensive medications: losartan , labetalol , and amlodipine . Takes these as prescribed. His recent blood pressure readings have been around 137/93 mmHg. He monitors his blood pressure at home but not his blood sugar, which he reports was good when last checked. No chest pain, palpitations, vision changes, or headaches. Scheduled to establish with Cardiology tomorrow.   His past medical history includes a highest recorded A1c of 7.6 two years ago, which has since improved to 5.8, placing him in the prediabetes range. He is also taking Farxiga  as part of his medication regimen.   Review of Systems  All other systems reviewed and are negative.   Past Medical History:  Diagnosis Date   Diabetes mellitus without complication (HCC)    Hypertension    Kidney stone     History reviewed. No pertinent surgical history.  Outpatient Medications Prior to Visit  Medication Sig Dispense Refill   amLODipine  (NORVASC ) 10 MG  tablet TAKE 1 TABLET BY MOUTH EVERY DAY 90 tablet 0   dapagliflozin  propanediol (FARXIGA ) 10 MG TABS tablet Take 10 mg by mouth daily.     labetalol  (NORMODYNE ) 100 MG tablet TAKE 1 TABLET BY MOUTH TWICE A DAY 180 tablet 1   losartan  (COZAAR ) 100 MG tablet TAKE 1 TABLET BY MOUTH EVERY DAY 90 tablet 1   tirzepatide  (MOUNJARO ) 7.5 MG/0.5ML Pen Inject 7.5 mg into the skin once a week. 6 mL 0   Blood Glucose Monitoring Suppl (BLOOD GLUCOSE SYSTEM PAK) KIT Please dispense based on patient and insurance preference. Use as directed to monitor FSBS up to 3x daily. Dx: E11.65. (Patient not taking: Reported on 04/24/2024) 1 each 1   dapagliflozin  propanediol (FARXIGA ) 10 MG TABS tablet Take 1 tablet (10 mg total) by mouth daily. (Patient not taking: Reported on 04/24/2024) 90 tablet 2   glucose blood test strip Use as instructed (Patient not taking: Reported on 04/24/2024) 100 each 12   Lancets MISC Please dispense based on patient and insurance preference. Use as directed to monitor FSBS up to 3x daily. Dx: E11.65. (Patient not taking: Reported on 04/24/2024) 100 each 1   tirzepatide  (MOUNJARO ) 7.5 MG/0.5ML Pen Inject 7.5 mg into the skin once a week. (Patient not taking: Reported on 04/24/2024) 6 mL 0   No facility-administered medications prior to visit.    Allergies[1]     Objective:    BP (!) 137/93   Pulse 95   Ht 6' 1 (1.854 m)   Wt (!) 305  lb 6.4 oz (138.5 kg)   SpO2 95%   BMI 40.29 kg/m  BP Readings from Last 3 Encounters:  04/24/24 (!) 137/93  01/11/24 (!) 160/105  04/27/23 139/78   Wt Readings from Last 3 Encounters:  04/24/24 (!) 305 lb 6.4 oz (138.5 kg)  01/11/24 (!) 328 lb 6.4 oz (149 kg)  04/26/23 (!) 311 lb 1.1 oz (141.1 kg)      Physical Exam Vitals and nursing note reviewed.  Constitutional:      Appearance: Normal appearance. He is obese.  HENT:     Head: Normocephalic and atraumatic.  Cardiovascular:     Rate and Rhythm: Normal rate and regular rhythm.      Pulses: Normal pulses.     Heart sounds: Normal heart sounds.  Pulmonary:     Effort: Pulmonary effort is normal.     Breath sounds: Normal breath sounds.  Skin:    General: Skin is warm and dry.     Capillary Refill: Capillary refill takes less than 2 seconds.  Neurological:     General: No focal deficit present.     Mental Status: He is alert and oriented to person, place, and time. Mental status is at baseline.  Psychiatric:        Mood and Affect: Mood normal.        Behavior: Behavior normal.        Thought Content: Thought content normal.        Judgment: Judgment normal.       No results found for any visits on 04/24/24.     Assessment & Plan:   Problem List Items Addressed This Visit       Cardiovascular and Mediastinum   Essential hypertension - Primary     Endocrine   Type 2 diabetes mellitus with hyperglycemia, without long-term current use of insulin (HCC)   Relevant Medications   tirzepatide  (MOUNJARO ) 10 MG/0.5ML Pen     Other   Morbid obesity (HCC)   Relevant Medications   tirzepatide  (MOUNJARO ) 10 MG/0.5ML Pen    Assessment and Plan Assessment & Plan Essential hypertension Blood pressure remains elevated at 137/93 mmHg despite maximum doses of losartan , amlodipine , and labetalol .  - Continue current antihypertensive regimen. - Keep appointment with cardiology tomorrow for further evaluation and management.  Type 2 diabetes mellitus A1c improved to 5.8%, indicating good glycemic control. No hypoglycemic episodes reported. - Continue current diabetes management regimen with Farxiga  and increase Mounjaro  to 10mg  weekly. - Monitor blood glucose levels as needed.  Morbid obesity Significant weight loss achieved through increased physical activity and dietary changes. Currently on tirzepatide  7.5 mg weekly with plans to increase to 10 mg. No adverse effects reported from tirzepatide . - Increased tirzepatide  to 10 mg weekly. - Continue current  exercise regimen and dietary modifications.    Meds ordered this encounter  Medications   tirzepatide  (MOUNJARO ) 10 MG/0.5ML Pen    Sig: Inject 10 mg into the skin once a week.    Dispense:  6 mL    Refill:  0    Supervising Provider:   DUANNE LOWERS T [[id number]]    Return in about 3 months (around 07/23/2024) for chronic follow-up with labs 1 week prior.  John GORMAN Barrio, FNP [place] [facility]      [1]  Allergies Allergen Reactions   Lisinopril  Swelling    Lip swelling. angioedema   "

## 2024-04-25 ENCOUNTER — Ambulatory Visit (INDEPENDENT_AMBULATORY_CARE_PROVIDER_SITE_OTHER): Admitting: Family

## 2024-04-25 ENCOUNTER — Encounter (HOSPITAL_BASED_OUTPATIENT_CLINIC_OR_DEPARTMENT_OTHER): Payer: Self-pay | Admitting: Family

## 2024-04-25 ENCOUNTER — Other Ambulatory Visit (HOSPITAL_BASED_OUTPATIENT_CLINIC_OR_DEPARTMENT_OTHER): Payer: Self-pay

## 2024-04-25 VITALS — BP 133/89 | HR 92 | Ht 73.0 in | Wt 308.9 lb

## 2024-04-25 DIAGNOSIS — E66813 Obesity, class 3: Secondary | ICD-10-CM

## 2024-04-25 DIAGNOSIS — I10 Essential (primary) hypertension: Secondary | ICD-10-CM

## 2024-04-25 DIAGNOSIS — E1165 Type 2 diabetes mellitus with hyperglycemia: Secondary | ICD-10-CM | POA: Diagnosis not present

## 2024-04-25 DIAGNOSIS — Z6841 Body Mass Index (BMI) 40.0 and over, adult: Secondary | ICD-10-CM

## 2024-04-25 MED ORDER — AMLODIPINE-OLMESARTAN 10-40 MG PO TABS: 1.0000 | ORAL_TABLET | Freq: Every day | ORAL | 1 refills | Status: AC

## 2024-04-25 NOTE — Patient Instructions (Addendum)
 Medication Instructions:  STOP Losartan   STOP Amlodipine   CONTINUE Labetolol one tablet twice daily  START Amlodipine -Olmesartan 10-40mg  daily   Labwork: Your physician recommends that you return for lab work in 1-2 weeks after starting new blood pressure medication   Testing/Procedures: Your physician has requested that you have a carotid duplex. This test is an ultrasound of the carotid arteries in your neck. It looks at blood flow through these arteries that supply the brain with blood. Allow one hour for this exam. There are no restrictions or special instructions.  Your physician has requested that you have a renal artery duplex. During this test, an ultrasound is used to evaluate blood flow to the kidneys. Allow one hour for this exam. Do not eat after midnight the day before and avoid carbonated beverages. Take your medications as you usually do.    Follow-Up: Please follow up in 2-3 months in [facility]V HTN CLINIC with Dr. Raford, Reche Finder, NP or Allean Mink PharmD    Special Instructions:     We also have a [facility] at our [employer] location--[address] on the 1st floor  [employer] for Alternative locations and appointment scheduling   [email or web address]   [email or web address]  [phone]

## 2024-04-25 NOTE — Progress Notes (Signed)
 w  [facility] Initial Assessment:    Date:  04/25/2024   ID:  John Cisneros, DOB 30-Mar-1981, MRN [id number]  PCP:  Kayla Jeoffrey RAMAN, FNP  Cardiologist:  None  Nephrologist:  Referring MD: Kayla Jeoffrey RAMAN, FNP   CC: Hypertension  History of Present Illness:     Discussed the use of AI scribe software for clinical note transcription with the patient, who gave verbal consent to proceed.  History of Present Illness John Cisneros is a 43 year old male with hypertension who presents to establish with [facility]. Works for [employer].   His hypertension was diagnosed in his twenties and control fluctuates with weight. With recent weight loss through exercise and diet, his home blood pressures are 128-136/85-91 mmHg and are lower than in the office.  His current regimen is losartan , labetalol , amlodipine , and Farxiga .  He had lip swelling (treated with Benadryl) with lisinopril  but has tolerated losartan . He uses Tylenol  only occasionally.  He exercises regularly with cardio and weights and is reducing processed foods and sodium. He splits meals between home and dining out.  Family history is notable for hypertension in both parents and grandparents.  He denies palpitations, lower extremity edema, and symptoms of sleep apnea, though he sometimes feels tired on waking. He uses caffeine in moderation and drinks alcohol rarely in social settings.   Previous antihypertensives:   Past Medical History:  Diagnosis Date   Diabetes mellitus without complication (HCC)    Hypertension    Kidney stone     No past surgical history on file.  Current Medications: Active Medications[1]   Allergies:   Lisinopril    Social History   Socioeconomic History   Marital status: Married    Spouse name: Not on file   Number of children: Not on file   Years of education: Not on file   Highest education level: Not on file  Occupational History   Not on  file  Tobacco Use   Smoking status: Former    Current packs/day: 0.50    Types: Cigarettes   Smokeless tobacco: Never  Vaping Use   Vaping status: Never Used  Substance and Sexual Activity   Alcohol use: Yes    Comment: occasional   Drug use: No   Sexual activity: Yes  Other Topics Concern   Not on file  Social History Narrative   Not on file   Social Drivers of Health   Tobacco Use: Medium Risk (04/25/2024)   Patient History    Smoking Tobacco Use: Former    Smokeless Tobacco Use: Never    Passive Exposure: Not on Actuary Strain: Low Risk (04/25/2024)   Overall Financial Resource Strain (CARDIA)    Difficulty of Paying Living Expenses: Not very hard  Food Insecurity: No Food Insecurity (04/25/2024)   Epic    Worried About Radiation Protection Practitioner of Food in the Last Year: Never true    Ran Out of Food in the Last Year: Never true  Transportation Needs: No Transportation Needs (04/25/2024)   Epic    Lack of Transportation (Medical): No    Lack of Transportation (Non-Medical): No  Physical Activity: Sufficiently Active (04/25/2024)   Exercise Vital Sign    Days of Exercise per Week: 5 days    Minutes of Exercise per Session: 60 min  Stress: No Stress Concern Present (04/25/2024)   [employer] of Occupational Health - Occupational Stress Questionnaire    Feeling of Stress: Only a little  Social Connections: Socially Integrated (04/25/2024)   Social Connection and Isolation Panel    Frequency of Communication with Friends and Family: More than three times a week    Frequency of Social Gatherings with Friends and Family: Once a week    Attends Religious Services: 1 to 4 times per year    Active Member of Clubs or Organizations: Yes    Attends Banker Meetings: 1 to 4 times per year    Marital Status: Married  Depression (PHQ2-9): Low Risk (04/24/2024)   Depression (PHQ2-9)    PHQ-2 Score: 3  Alcohol Screen: Low Risk (04/25/2024)   Alcohol  Screen    Last Alcohol Screening Score (AUDIT): 2  Housing: Low Risk (04/25/2024)   Epic    Unable to Pay for Housing in the Last Year: No    Number of Times Moved in the Last Year: 0    Homeless in the Last Year: No  Utilities: Not At Risk (04/25/2024)   Epic    Threatened with loss of utilities: No  Health Literacy: Adequate Health Literacy (04/25/2024)   B1300 Health Literacy    Frequency of need for help with medical instructions: Never     Family History: The patient's family history includes Heart disease in his maternal grandfather, maternal grandmother, paternal grandfather, and paternal grandmother; Hypertension in his father and mother.  ROS:   Please see the history of present illness.     All other systems reviewed and are negative.  EKGs/Labs/Other Studies Reviewed:    EKG Interpretation Date/Time:  Wednesday April 25 2024 15:07:18 EST Ventricular Rate:  85 PR Interval:  148 QRS Duration:  94 QT Interval:  366 QTC Calculation: 435 R Axis:   40  Text Interpretation: Normal sinus rhythm Normal ECG Confirmed by Vannie Mora ([id number]) on 04/25/2024 3:33:36 PM    Recent Labs: 01/11/2024: ALT 36; BUN 21; Creat 1.19; Hemoglobin 15.9; Platelets 274; Potassium 4.2; Sodium 138; TSH 0.71   Recent Lipid Panel    Component Value Date/Time   CHOL 109 01/11/2024 1530   TRIG 195 (H) 01/11/2024 1530   HDL 36 (L) 01/11/2024 1530   CHOLHDL 3.0 01/11/2024 1530   VLDL 17 01/28/2015 1028   LDLCALC 46 01/11/2024 1530    Physical Exam:   VS:  BP 133/89 (BP Location: Right Arm)   Pulse 92   Ht 6' 1 (1.854 m)   Wt (!) 308 lb 14.4 oz (140.1 kg)   SpO2 97%   BMI 40.75 kg/m  , BMI Body mass index is 40.75 kg/m. GENERAL:  Well appearing HEENT: Pupils equal round and reactive, fundi not visualized, oral mucosa unremarkable NECK:  No jugular venous distention, waveform within normal limits, carotid upstroke brisk and symmetric, no bruits, no thyromegaly LYMPHATICS:  No  cervical adenopathy LUNGS:  Clear to auscultation bilaterally HEART:  RRR.  PMI not displaced or sustained,S1 and S2 within normal limits, no S3, no S4, no clicks, no rubs, no murmurs ABD:  Flat, positive bowel sounds normal in frequency in pitch, no bruits, no rebound, no guarding, no midline pulsatile mass, no hepatomegaly, no splenomegaly EXT:  2 plus pulses throughout, no edema, no cyanosis no clubbing SKIN:  No rashes no nodules NEURO:  Cranial nerves II through XII grossly intact, motor grossly intact throughout PSYCH:  Cognitively intact, oriented to person place and time   ASSESSMENT/PLAN:    Assessment & Plan Essential hypertension Reasonably-controlled with lifestyle modifications and medication. Discussed potential causes and benefits of switching  losartan  to a more potent ARB with amlodipine  for better coverage and possible labetalol  reduction. 12/2023 normal TSH. Prior imaging with normal adrenals.  - Ordered ultrasound of carotid and renal arteries to assess for renal artery stenosis and carotid stenosis given hypertension diagnosed at young age and asymmetric BP. - Stop losartan , amlodipine . Start Amlodipine -Olmesartan  10-40mg  daily.  - Continue labetalol  100mg  twice daily. (Hopeful to reduce in future to simplify regimen) - Labs: BMP, renin-aldo in 1-2 weeks after new medication. - Sent MyChart message in 2 weeks to assess blood pressure. - Scheduled follow-up in 2-3 months.  Obesity Weight loss efforts ongoing with goal to reduce below 300 lbs and further 60 lbs. Weight loss has improved blood pressure and A1c. - Continue current exercise regimen including cardio and strength training. - Encouraged dietary modifications to reduce sodium intake. - Set non-food related rewards for weight loss milestones.  Type 2 diabetes mellitus Improved A1c due to weight loss and lifestyle changes. Mounjaro  increased to aid further weight loss. Discussed Farxiga  benefits in preventing  kidney disease and heart attack. - Continue Mounjaro  with potential dose adjustment if weight loss plateaus. - Continue Farxiga  for additional benefits in diabetes management.    Screening for Secondary Hypertension:     Relevant Labs/Studies:    Latest Ref Rng & Units 01/11/2024    3:30 PM 04/27/2023    2:16 AM 01/25/2022    8:29 AM  Basic Labs  Sodium 135 - 146 mmol/L 138  137  140   Potassium 3.5 - 5.3 mmol/L 4.2  4.0  4.4   Creatinine 0.60 - 1.29 mg/dL 8.80  8.75  8.73        Latest Ref Rng & Units 01/11/2024    3:30 PM 05/20/2011    4:29 PM  Thyroid   TSH 0.40 - 4.50 mIU/L 0.71  1.527                   Disposition:    FU with MD/APP/PharmD in 2-3 months    Medication Adjustments/Labs and Tests Ordered: Current medicines are reviewed at length with the patient today.  Concerns regarding medicines are outlined above.  Orders Placed This Encounter  Procedures   EKG 12-Lead   No orders of the defined types were placed in this encounter.    Signed, Reche GORMAN Finder, NP  04/25/2024 3:55 PM    [facility]     [1]  Current Meds  Medication Sig   amLODipine  (NORVASC ) 10 MG tablet TAKE 1 TABLET BY MOUTH EVERY DAY   dapagliflozin  propanediol (FARXIGA ) 10 MG TABS tablet Take 10 mg by mouth daily.   labetalol  (NORMODYNE ) 100 MG tablet TAKE 1 TABLET BY MOUTH TWICE A DAY   losartan  (COZAAR ) 100 MG tablet TAKE 1 TABLET BY MOUTH EVERY DAY   loteprednol (LOTEMAX) 0.2 % SUSP Apply 1 drop to eye 2 (two) times daily.   tirzepatide  (MOUNJARO ) 10 MG/0.5ML Pen Inject 10 mg into the skin once a week. (Patient taking differently: Inject 7.5 mg into the skin once a week. 7.5 mg right now 04/25/24. Starts 10 mg in 2-3 weeks.)

## 2024-04-30 ENCOUNTER — Encounter (HOSPITAL_BASED_OUTPATIENT_CLINIC_OR_DEPARTMENT_OTHER): Payer: Self-pay | Admitting: Family

## 2024-05-07 ENCOUNTER — Other Ambulatory Visit: Payer: Self-pay

## 2024-05-16 ENCOUNTER — Other Ambulatory Visit: Payer: Self-pay

## 2024-06-18 ENCOUNTER — Encounter (HOSPITAL_BASED_OUTPATIENT_CLINIC_OR_DEPARTMENT_OTHER)

## 2024-07-23 ENCOUNTER — Other Ambulatory Visit

## 2024-07-26 ENCOUNTER — Encounter (HOSPITAL_BASED_OUTPATIENT_CLINIC_OR_DEPARTMENT_OTHER): Admitting: Family

## 2024-07-30 ENCOUNTER — Ambulatory Visit: Admitting: Family Medicine

## 2025-01-09 ENCOUNTER — Other Ambulatory Visit

## 2025-01-14 ENCOUNTER — Encounter: Admitting: Family Medicine
